# Patient Record
Sex: Female | Born: 1940
Health system: Southern US, Community
[De-identification: ages and names within clinical notes are randomized; demographics above are authoritative.]

## PROBLEM LIST (undated history)

## (undated) DIAGNOSIS — D259 Leiomyoma of uterus, unspecified: Secondary | ICD-10-CM

## (undated) DIAGNOSIS — F419 Anxiety disorder, unspecified: Secondary | ICD-10-CM

## (undated) DIAGNOSIS — L80 Vitiligo: Secondary | ICD-10-CM

## (undated) DIAGNOSIS — E785 Hyperlipidemia, unspecified: Secondary | ICD-10-CM

## (undated) DIAGNOSIS — K5792 Diverticulitis of intestine, part unspecified, without perforation or abscess without bleeding: Secondary | ICD-10-CM

## (undated) DIAGNOSIS — I10 Essential (primary) hypertension: Secondary | ICD-10-CM

## (undated) DIAGNOSIS — M1990 Unspecified osteoarthritis, unspecified site: Secondary | ICD-10-CM

## (undated) DIAGNOSIS — K589 Irritable bowel syndrome without diarrhea: Secondary | ICD-10-CM

## (undated) DIAGNOSIS — F329 Major depressive disorder, single episode, unspecified: Secondary | ICD-10-CM

## (undated) DIAGNOSIS — K219 Gastro-esophageal reflux disease without esophagitis: Secondary | ICD-10-CM

## (undated) DIAGNOSIS — F32A Depression, unspecified: Secondary | ICD-10-CM

## (undated) DIAGNOSIS — K5732 Diverticulitis of large intestine without perforation or abscess without bleeding: Secondary | ICD-10-CM

## (undated) HISTORY — DX: Leiomyoma of uterus, unspecified: D25.9

## (undated) HISTORY — PX: NO PAST SURGERIES: SHX2092

## (undated) HISTORY — PX: COLONOSCOPY: SHX174

## (undated) HISTORY — DX: Hyperlipidemia, unspecified: E78.5

## (undated) HISTORY — DX: Diverticulitis of large intestine without perforation or abscess without bleeding: K57.32

---

## 1997-11-01 LAB — HM DEXA SCAN

## 2001-07-20 LAB — HM MAMMOGRAPHY

## 2011-01-05 ENCOUNTER — Other Ambulatory Visit: Payer: Self-pay | Admitting: Neurology

## 2011-01-05 DIAGNOSIS — R269 Unspecified abnormalities of gait and mobility: Secondary | ICD-10-CM

## 2011-01-05 DIAGNOSIS — I10 Essential (primary) hypertension: Secondary | ICD-10-CM

## 2011-01-10 ENCOUNTER — Ambulatory Visit
Admission: RE | Admit: 2011-01-10 | Discharge: 2011-01-10 | Disposition: A | Discharge status: Home / Self Care | Payer: MEDICARE | Source: Ambulatory Visit | Attending: Neurology | Admitting: Neurology

## 2011-01-10 DIAGNOSIS — R269 Unspecified abnormalities of gait and mobility: Secondary | ICD-10-CM

## 2011-01-10 DIAGNOSIS — I10 Essential (primary) hypertension: Secondary | ICD-10-CM

## 2012-01-09 ENCOUNTER — Encounter (HOSPITAL_COMMUNITY): Payer: Self-pay | Admitting: *Deleted

## 2012-01-09 ENCOUNTER — Emergency Department (INDEPENDENT_AMBULATORY_CARE_PROVIDER_SITE_OTHER)
Admission: EM | Admit: 2012-01-09 | Discharge: 2012-01-09 | Disposition: A | Discharge status: Home / Self Care | Payer: Medicare Other | Source: Home / Self Care | Attending: Emergency Medicine | Admitting: Emergency Medicine

## 2012-01-09 DIAGNOSIS — K5732 Diverticulitis of large intestine without perforation or abscess without bleeding: Secondary | ICD-10-CM | POA: Diagnosis not present

## 2012-01-09 DIAGNOSIS — K5792 Diverticulitis of intestine, part unspecified, without perforation or abscess without bleeding: Secondary | ICD-10-CM

## 2012-01-09 HISTORY — DX: Irritable bowel syndrome, unspecified: K58.9

## 2012-01-09 HISTORY — DX: Essential (primary) hypertension: I10

## 2012-01-09 HISTORY — DX: Anxiety disorder, unspecified: F41.9

## 2012-01-09 HISTORY — DX: Diverticulitis of intestine, part unspecified, without perforation or abscess without bleeding: K57.92

## 2012-01-09 LAB — POCT URINALYSIS DIP (DEVICE)
Protein, ur: NEGATIVE mg/dL
Specific Gravity, Urine: 1.02 (ref 1.005–1.030)
Urobilinogen, UA: 0.2 mg/dL (ref 0.0–1.0)
pH: 7 (ref 5.0–8.0)

## 2012-01-09 MED ORDER — CIPROFLOXACIN HCL 500 MG PO TABS: 500.0000 mg | ORAL_TABLET | Freq: Two times a day (BID) | ORAL | Status: AC

## 2012-01-09 MED ORDER — METRONIDAZOLE 500 MG PO TABS: 500.0000 mg | ORAL_TABLET | Freq: Three times a day (TID) | ORAL | Status: AC

## 2012-01-09 NOTE — Discharge Instructions (Signed)
Go to the ED immediately for the symptoms we discussed, if you do not get better after being on the antibiotics for 3 days, or any other concerns.

## 2012-01-09 NOTE — ED Notes (Signed)
Pt with c/o left lower abdominal pain x one week - pt states "I know it's diverticulitis" - ibuprofen and heating pad used without relief - - denies n/v - denies urinary symptoms - same pain as past diverticulitis

## 2012-01-09 NOTE — ED Provider Notes (Signed)
History     CSN: [id number]  Arrival date & time 01/09/12  1305   First MD Initiated Contact with Patient 01/09/12 1439      Chief Complaint  Patient presents with  . Abdominal Pain    (Consider location/radiation/quality/duration/timing/severity/associated sxs/prior treatment) HPI Comments: Patient with intermittent, mild, nonradiating left lower quadrant pain for the past week. No abdominal distention, anorexia. No diarrhea, constipation. Is having normal bowel movements. No rectal bleeding, melena, hematochezia. No nausea, vomiting, fevers. No urinary urgency, frequency, hematuria, cloudy or oderous urine, back pain. No vaginal bleeding or discharge. She has been using ibuprofen and heating pad with minimal relief. No aggravating factors. She states this feels identical to previous episodes diverticulitis, and is requesting antibiotics. States that she had a normal colonoscopy 3 years ago  ROS as noted in HPI. All other ROS negative.     Patient is a 71 y.o. female presenting with abdominal pain. The history is provided by the patient. No language interpreter was used.  Abdominal Pain The primary symptoms of the illness include abdominal pain. The current episode started more than 2 days ago. The onset of the illness was gradual. The problem has not changed since onset. The patient states that she believes she is currently not pregnant. The patient has not had a change in bowel habit. Symptoms associated with the illness do not include chills, anorexia, diaphoresis, heartburn, constipation, urgency, hematuria, frequency or back pain.    Past Medical History  Diagnosis Date  . Hypertension   . Diverticulitis   . IBS (irritable bowel syndrome)   . Anxiety     History reviewed. No pertinent past surgical history.  History reviewed. No pertinent family history.  History  Substance Use Topics  . Smoking status: Former Games developer  . Smokeless tobacco: Not on file  . Alcohol Use:  Yes    OB History    Grav Para Term Preterm Abortions TAB SAB Ect Mult Living                  Review of Systems  Constitutional: Negative for chills and diaphoresis.  Gastrointestinal: Positive for abdominal pain. Negative for heartburn, constipation and anorexia.  Genitourinary: Negative for urgency, frequency and hematuria.  Musculoskeletal: Negative for back pain.    Allergies  Eggs or egg-derived products  Home Medications   Current Outpatient Rx  Name Route Sig Dispense Refill  . ALPRAZOLAM 0.25 MG PO TABS Oral Take 0.25 mg by mouth at bedtime as needed.    Marland Kitchen AMLODIPINE BESY-BENAZEPRIL HCL PO Oral Take by mouth.    . IBUPROFEN 400 MG PO TABS Oral Take 400 mg by mouth every 6 (six) hours as needed.    Marland Kitchen CIPROFLOXACIN HCL 500 MG PO TABS Oral Take 1 tablet (500 mg total) by mouth every 12 (twelve) hours. X 10 days 20 tablet 0  . METRONIDAZOLE 500 MG PO TABS Oral Take 1 tablet (500 mg total) by mouth 3 (three) times daily. X 10 days 30 tablet 0    BP 166/87  Pulse 72  Temp 98.8 F (37.1 C) (Oral)  Resp 18  SpO2 96%  Physical Exam  Nursing note and vitals reviewed. Constitutional: She is oriented to person, place, and time. She appears well-developed and well-nourished. No distress.  HENT:  Head: Normocephalic and atraumatic.  Eyes: Conjunctivae and EOM are normal.  Neck: Normal range of motion.  Cardiovascular: Normal rate, regular rhythm and normal heart sounds.   Pulmonary/Chest: Effort normal and breath sounds  normal.  Abdominal: Soft. Normal appearance and bowel sounds are normal. She exhibits no distension. There is tenderness in the left lower quadrant. There is no rigidity, no rebound, no guarding, no CVA tenderness, no tenderness at McBurney's point and negative Murphy's sign. No hernia.       Patient refused rectal exam  Musculoskeletal: Normal range of motion.  Neurological: She is alert and oriented to person, place, and time.  Skin: Skin is warm and  dry.  Psychiatric: She has a normal mood and affect. Her behavior is normal. Judgment and thought content normal.    ED Course  Procedures (including critical care time)  Labs Reviewed  POCT URINALYSIS DIP (DEVICE) - Abnormal; Notable for the following:    Hgb urine dipstick TRACE (*)     All other components within normal limits  LAB REPORT - SCANNED   No results found.   1. Diverticulitis     Results for orders placed during the hospital encounter of 01/09/12  POCT URINALYSIS DIP (DEVICE)      Component Value Range   Glucose, UA NEGATIVE  NEGATIVE mg/dL   Bilirubin Urine NEGATIVE  NEGATIVE   Ketones, ur NEGATIVE  NEGATIVE mg/dL   Specific Gravity, Urine 1.020  1.005 - 1.030   Hgb urine dipstick TRACE (*) NEGATIVE   pH 7.0  5.0 - 8.0   Protein, ur NEGATIVE  NEGATIVE mg/dL   Urobilinogen, UA 0.2  0.0 - 1.0 mg/dL   Nitrite NEGATIVE  NEGATIVE   Leukocytes, UA NEGATIVE  NEGATIVE     MDM  No previous records or labs available.  Pt abd exam is benign, no peritoneal signs. No evidence of surgical abd. Doubt SBO, mesenteric ischemia, appendicitis, hepatitis, cholecystitis, pancreatitis, or perforated viscus. No evidence to support or suggest GYN pathology such as ovarian torsion or infection. Will treat as uncomplicated diverticulitis, home with Cipro and Flagyl. She will followup with her primary care physician.  Patient hypertensive today pt states she forgot to take BP meds this am. Has no other red flags concerning for hypertensive emergency. Gave strict return precautions.  Luiz Blare, MD 01/10/12 2040

## 2012-01-19 DIAGNOSIS — I10 Essential (primary) hypertension: Secondary | ICD-10-CM | POA: Diagnosis not present

## 2012-01-19 DIAGNOSIS — K5732 Diverticulitis of large intestine without perforation or abscess without bleeding: Secondary | ICD-10-CM | POA: Diagnosis not present

## 2012-01-19 DIAGNOSIS — G4700 Insomnia, unspecified: Secondary | ICD-10-CM | POA: Diagnosis not present

## 2012-01-19 DIAGNOSIS — E785 Hyperlipidemia, unspecified: Secondary | ICD-10-CM | POA: Diagnosis not present

## 2012-02-11 ENCOUNTER — Emergency Department (INDEPENDENT_AMBULATORY_CARE_PROVIDER_SITE_OTHER)
Admission: EM | Admit: 2012-02-11 | Discharge: 2012-02-11 | Disposition: A | Discharge status: Home / Self Care | Payer: Medicare Other | Source: Home / Self Care | Attending: Emergency Medicine | Admitting: Emergency Medicine

## 2012-02-11 ENCOUNTER — Encounter (HOSPITAL_COMMUNITY): Payer: Self-pay

## 2012-02-11 DIAGNOSIS — K5732 Diverticulitis of large intestine without perforation or abscess without bleeding: Secondary | ICD-10-CM

## 2012-02-11 DIAGNOSIS — K5792 Diverticulitis of intestine, part unspecified, without perforation or abscess without bleeding: Secondary | ICD-10-CM

## 2012-02-11 MED ORDER — CIPROFLOXACIN HCL 500 MG PO TABS: ORAL_TABLET | ORAL | Status: DC

## 2012-02-11 MED ORDER — METRONIDAZOLE 250 MG PO TABS: 250.0000 mg | ORAL_TABLET | Freq: Three times a day (TID) | ORAL | Status: DC

## 2012-02-11 NOTE — ED Notes (Signed)
C/o her lower abdomin has been hurting for past couple of days, feels like her diverticulitis is cutting up again, c/o she could not get in to see her MD today , so she came here. Has been using motrin and a heating pad w  Minimal relief . In the past , she has done well w Rx antibioptics, but her MD in [place] had always given her flagyl AND  a  DOUBLE  dose of ceftin for first dose, and then q 12 h ; she feels she would do well w this combination again; NAD

## 2012-02-11 NOTE — ED Provider Notes (Signed)
Chief Complaint  Patient presents with  . Abdominal Pain    History of Present Illness:    Glenda Abbott is a 71 year old female with a history of diverticulitis and she thinks she's having a flare up right now. This is her second flare up in the past 6 years. She first noted left lower quadrant pain without radiation in June of this year, about a month ago. The pain was a constant ache, worse with eating especially certain foods such as greasy foods and better with ibuprofen. She came here on July 1. Her urinalysis was normal. She was given Cipro and metronidazole. The pain seemed to get better and she saw her primary care physician, Dr. Bufford Spikes who felt that she was getting better. Over the past week the pain has flared back up again. She denies any fever, chills, nausea, or vomiting. Her appetite has been good and her weight is down about 17 pounds in the last year. She attributes this to the death of her husband. She denies any dysuria, frequency, urgency, or hematuria. Her bowel movements are regular. She denies any blood in the stool. She does have a history of irritable bowel syndrome. Her last colonoscopy was October 2011. She denies any GYN complaints. She hasn't had a pelvic exam or Pap smear since she turned 67, but states she does not want to have a pelvic exam done today.  Review of Systems:  Other than noted above, the patient denies any of the following symptoms: Constitutional:  No fever, chills, fatigue, weight loss or anorexia. Lungs:  No cough or shortness of breath. Heart:  No chest pain, palpitations, syncope or edema.  No cardiac history. Abdomen:  No nausea, vomiting, hematememesis, melena, diarrhea, or hematochezia. GU:  No dysuria, frequency, urgency, or hematuria. Gyn:  No vaginal discharge, itching, abnormal bleeding, dyspareunia, or pelvic pain. Skin:  No rash or itching.  PMFSH:  Past medical history, family history, social history, meds, and allergies were reviewed along  with nurse's notes.  No prior abdominal surgeries, past history of GI problems, STDs or GYN problems.  No history of aspirin or NSAID use.  No excessive alcohol intake.  Physical Exam:   Vital signs:  BP 147/85  Pulse 87  Temp 98.5 F (36.9 C) (Oral)  Resp 16  SpO2 97% Gen:  Alert, oriented, in no distress. Lungs:  Breath sounds clear and equal bilaterally.  No wheezes, rales or rhonchi. Heart:  Regular rhythm.  No gallops or murmers.   Abdomen:  Abdomen is soft, flat, nondistended. There is no organomegaly or mass. Bowel sounds are normally active. She has mild left lower quadrant tenderness to palpation without guarding or rebound. Skin:  Clear, warm and dry.  No rash.  Labs:   Results for orders placed during the hospital encounter of 01/09/12  POCT URINALYSIS DIP (DEVICE)      Component Value Range   Glucose, UA NEGATIVE  NEGATIVE mg/dL   Bilirubin Urine NEGATIVE  NEGATIVE   Ketones, ur NEGATIVE  NEGATIVE mg/dL   Specific Gravity, Urine 1.020  1.005 - 1.030   Hgb urine dipstick TRACE (*) NEGATIVE   pH 7.0  5.0 - 8.0   Protein, ur NEGATIVE  NEGATIVE mg/dL   Urobilinogen, UA 0.2  0.0 - 1.0 mg/dL   Nitrite NEGATIVE  NEGATIVE   Leukocytes, UA NEGATIVE  NEGATIVE    Assessment:  The encounter diagnosis was Diverticulitis. This is most likely diverticulitis, however I told the patient that there are many other  things that can cause this kind of pain including ovarian cyst, ovarian cancer, torsion of the ovary, irritable bowel syndrome, kidney stone or kidney infection, or ischemic bowel disease. I recommended that she followup with her primary care physician in 2 weeks. I think if she's not better at the end of that time a CT scan would be warranted.  Plan:   1.  The following meds were prescribed:   New Prescriptions   CIPROFLOXACIN (CIPRO) 500 MG TABLET    Take 2 tablets now and then 1 BID   METRONIDAZOLE (FLAGYL) 250 MG TABLET    Take 1 tablet (250 mg total) by mouth 3 (three)  times daily.   2.  The patient was instructed in symptomatic care and handouts were given. 3.  The patient was told to return if becoming worse in any way, if no better in 3 or 4 days, and given some red flag symptoms that would indicate earlier return.    Reuben Likes, MD 02/11/12 [phone]

## 2012-02-16 ENCOUNTER — Emergency Department (HOSPITAL_COMMUNITY)
Admission: EM | Admit: 2012-02-16 | Discharge: 2012-02-17 | Disposition: A | Discharge status: Home / Self Care | Payer: Medicare Other | Attending: Emergency Medicine | Admitting: Emergency Medicine

## 2012-02-16 ENCOUNTER — Encounter (HOSPITAL_COMMUNITY): Payer: Self-pay | Admitting: Emergency Medicine

## 2012-02-16 DIAGNOSIS — N859 Noninflammatory disorder of uterus, unspecified: Secondary | ICD-10-CM | POA: Diagnosis not present

## 2012-02-16 DIAGNOSIS — R509 Fever, unspecified: Secondary | ICD-10-CM | POA: Diagnosis not present

## 2012-02-16 DIAGNOSIS — R1900 Intra-abdominal and pelvic swelling, mass and lump, unspecified site: Secondary | ICD-10-CM | POA: Diagnosis not present

## 2012-02-16 DIAGNOSIS — K5730 Diverticulosis of large intestine without perforation or abscess without bleeding: Secondary | ICD-10-CM | POA: Diagnosis not present

## 2012-02-16 DIAGNOSIS — R1032 Left lower quadrant pain: Secondary | ICD-10-CM | POA: Diagnosis not present

## 2012-02-16 DIAGNOSIS — R197 Diarrhea, unspecified: Secondary | ICD-10-CM | POA: Insufficient documentation

## 2012-02-16 DIAGNOSIS — R10819 Abdominal tenderness, unspecified site: Secondary | ICD-10-CM | POA: Diagnosis not present

## 2012-02-16 DIAGNOSIS — R188 Other ascites: Secondary | ICD-10-CM | POA: Diagnosis not present

## 2012-02-16 DIAGNOSIS — I10 Essential (primary) hypertension: Secondary | ICD-10-CM | POA: Diagnosis not present

## 2012-02-16 DIAGNOSIS — N9489 Other specified conditions associated with female genital organs and menstrual cycle: Secondary | ICD-10-CM | POA: Insufficient documentation

## 2012-02-16 DIAGNOSIS — D259 Leiomyoma of uterus, unspecified: Secondary | ICD-10-CM | POA: Diagnosis not present

## 2012-02-16 LAB — CBC WITH DIFFERENTIAL/PLATELET
Basophils Absolute: 0 10*3/uL (ref 0.0–0.1)
Eosinophils Absolute: 0 10*3/uL (ref 0.0–0.7)
Eosinophils Relative: 0 % (ref 0–5)
HCT: 45.8 % (ref 36.0–46.0)
Lymphocytes Relative: 17 % (ref 12–46)
MCH: 29.9 pg (ref 26.0–34.0)
MCV: 88.4 fL (ref 78.0–100.0)
Monocytes Absolute: 0.6 10*3/uL (ref 0.1–1.0)
RDW: 13 % (ref 11.5–15.5)
WBC: 9.4 10*3/uL (ref 4.0–10.5)

## 2012-02-16 LAB — URINALYSIS, ROUTINE W REFLEX MICROSCOPIC
Nitrite: NEGATIVE
Specific Gravity, Urine: 1.02 (ref 1.005–1.030)
Urobilinogen, UA: 0.2 mg/dL (ref 0.0–1.0)

## 2012-02-16 LAB — COMPREHENSIVE METABOLIC PANEL
AST: 28 U/L (ref 0–37)
CO2: 29 mEq/L (ref 19–32)
Calcium: 10.1 mg/dL (ref 8.4–10.5)
Creatinine, Ser: 0.97 mg/dL (ref 0.50–1.10)
GFR calc Af Amer: 68 mL/min — ABNORMAL LOW (ref >90)
GFR calc non Af Amer: 58 mL/min — ABNORMAL LOW (ref >90)
Glucose, Bld: 111 mg/dL — ABNORMAL HIGH (ref 70–99)
Total Protein: 8.7 g/dL — ABNORMAL HIGH (ref 6.0–8.3)

## 2012-02-16 LAB — URINE MICROSCOPIC-ADD ON

## 2012-02-16 NOTE — ED Provider Notes (Signed)
History     CSN: [id number]  Arrival date & time 02/16/12  1725   First MD Initiated Contact with Patient 02/16/12 2034      Chief Complaint  Patient presents with  . Abdominal Pain    (Consider location/radiation/quality/duration/timing/severity/associated sxs/prior treatment) HPI Comments: Patient presents today with a chief complaint of abdominal pain.  Pain predominantly located in the LLQ.  Pain does not radiate.  She states that at times the pain is a crampy pain and at times the pain is sharp.  She reports that the pain has been intermittent for the past month.  She has a history of Diverticulitis and reports that the pain is similar to when she has had Diverticulitis in the past.  She was seen by Urgent Care approximately one month ago and given a ten day course of Cipro and Flagyl.  She was then seen at Urgent Care five days ago for this same pain.  At that time she was given another prescription for Cipro and Flagyl, which she reports that she is taking as directed.  Today she reports that she developed a fever.  Oral temperature of 101.6 orally.  She denies any nausea or vomiting.  Denies constipation, but reports intermittent diarrhea over the past 2 weeks.  No blood in her stool or melena.    Patient is a 71 y.o. female presenting with abdominal pain. The history is provided by the patient.  Abdominal Pain The primary symptoms of the illness include abdominal pain, fever and diarrhea. The primary symptoms of the illness do not include shortness of breath, nausea, vomiting, hematochezia, dysuria, vaginal discharge or vaginal bleeding.  The patient states that she believes she is currently not pregnant. The patient has had a change in bowel habit. Symptoms associated with the illness do not include chills, constipation, urgency, hematuria or frequency.    Past Medical History  Diagnosis Date  . Hypertension   . Diverticulitis   . IBS (irritable bowel syndrome)   . Anxiety      History reviewed. No pertinent past surgical history.  No family history on file.  History  Substance Use Topics  . Smoking status: Former Games developer  . Smokeless tobacco: Not on file  . Alcohol Use: Yes    OB History    Grav Para Term Preterm Abortions TAB SAB Ect Mult Living                  Review of Systems  Constitutional: Positive for fever. Negative for chills.  Respiratory: Negative for shortness of breath.   Cardiovascular: Negative for chest pain.  Gastrointestinal: Positive for abdominal pain and diarrhea. Negative for nausea, vomiting, constipation, blood in stool, hematochezia, abdominal distention, anal bleeding and rectal pain.  Genitourinary: Negative for dysuria, urgency, frequency, hematuria, flank pain, vaginal bleeding and vaginal discharge.  Psychiatric/Behavioral: Negative for self-injury.    Allergies  Eggs or egg-derived products  Home Medications   Current Outpatient Rx  Name Route Sig Dispense Refill  . ALPRAZOLAM 0.25 MG PO TABS Oral Take 0.25 mg by mouth at bedtime as needed. For sleep    . AMLODIPINE BESY-BENAZEPRIL HCL PO Oral Take by mouth.    Marland Kitchen CIPROFLOXACIN HCL 500 MG PO TABS Oral Take 500 mg by mouth 2 (two) times daily.    . IBUPROFEN 400 MG PO TABS Oral Take 400 mg by mouth every 6 (six) hours as needed. For pain    . METRONIDAZOLE 250 MG PO TABS Oral Take 250 mg  by mouth 3 (three) times daily.      BP 104/90  Pulse 86  Temp 98 F (36.7 C) (Oral)  Resp 18  SpO2 100%  Physical Exam  Nursing note and vitals reviewed. Constitutional: She appears well-developed and well-nourished. No distress.  HENT:  Head: Normocephalic and atraumatic.  Mouth/Throat: Oropharynx is clear and moist.  Neck: Normal range of motion. Neck supple.  Cardiovascular: Normal rate, regular rhythm and normal heart sounds.   Pulmonary/Chest: Effort normal and breath sounds normal.  Abdominal: Normal appearance and bowel sounds are normal. She exhibits no  mass. There is tenderness in the suprapubic area and left lower quadrant. There is no rigidity, no rebound, no guarding and no CVA tenderness.  Genitourinary: Vagina normal. There is no rash, tenderness or lesion on the right labia. There is no rash, tenderness or lesion on the left labia. Cervix exhibits no motion tenderness. Right adnexum displays no mass and no tenderness. Left adnexum displays no mass and no tenderness.  Neurological: She is alert.  Skin: Skin is warm and dry. She is not diaphoretic.  Psychiatric: She has a normal mood and affect.    ED Course  Procedures (including critical care time)  Labs Reviewed  CBC WITH DIFFERENTIAL - Abnormal; Notable for the following:    RBC 5.18 (*)     Hemoglobin 15.5 (*)     All other components within normal limits  COMPREHENSIVE METABOLIC PANEL - Abnormal; Notable for the following:    Glucose, Bld 111 (*)     Total Protein 8.7 (*)     Total Bilirubin 0.2 (*)     GFR calc non Af Amer 58 (*)     GFR calc Af Amer 68 (*)     All other components within normal limits  URINALYSIS, ROUTINE W REFLEX MICROSCOPIC - Abnormal; Notable for the following:    Leukocytes, UA SMALL (*)     All other components within normal limits  URINE MICROSCOPIC-ADD ON - Abnormal; Notable for the following:    Squamous Epithelial / LPF FEW (*)     Casts HYALINE CASTS (*)     All other components within normal limits      No diagnosis found.    MDM  Patient with history of Diverticulitis presents with intermittent LLQ abdominal pain.  She has finished a ten day course of Cipro and Flagyl and is currently on the sixth day of her second course.  Pain has not improved and today she developed a fever.  Labs unremarkable.  WBC within normal limits.   Patient with abnormal CT of ab/pelvis.  Radiology recommended getting an ultrasound of the pelvis.  Ultrasound has been ordered.  Patient discussed with Dr. Norlene Campbell who will follow up on the ultrasound results.           Pascal Lux [place], PA-C 02/17/12 1257

## 2012-02-16 NOTE — ED Notes (Signed)
PT c/o LLQ pain off and on x's 2 weeks,  St's she has diverticulosis and is having a flare up.  Pt denies any nausea or vomiting.  Pt st's temp was 101.6 earlier today

## 2012-02-17 ENCOUNTER — Emergency Department (HOSPITAL_COMMUNITY): Payer: Medicare Other

## 2012-02-17 ENCOUNTER — Encounter (HOSPITAL_COMMUNITY): Payer: Self-pay | Admitting: Radiology

## 2012-02-17 DIAGNOSIS — N859 Noninflammatory disorder of uterus, unspecified: Secondary | ICD-10-CM | POA: Diagnosis not present

## 2012-02-17 DIAGNOSIS — K5730 Diverticulosis of large intestine without perforation or abscess without bleeding: Secondary | ICD-10-CM | POA: Diagnosis not present

## 2012-02-17 DIAGNOSIS — D259 Leiomyoma of uterus, unspecified: Secondary | ICD-10-CM | POA: Diagnosis not present

## 2012-02-17 DIAGNOSIS — R188 Other ascites: Secondary | ICD-10-CM | POA: Diagnosis not present

## 2012-02-17 DIAGNOSIS — R1032 Left lower quadrant pain: Secondary | ICD-10-CM | POA: Diagnosis not present

## 2012-02-17 MED ORDER — IBUPROFEN 600 MG PO TABS: 600.0000 mg | ORAL_TABLET | Freq: Four times a day (QID) | ORAL | Status: AC | PRN

## 2012-02-17 MED ORDER — MORPHINE SULFATE 4 MG/ML IJ SOLN
4.0000 mg | Freq: Once | INTRAMUSCULAR | Status: AC
  Administered 2012-02-17: 4 mg via INTRAVENOUS
  Filled 2012-02-17: qty 1

## 2012-02-17 MED ORDER — ONDANSETRON HCL 4 MG PO TABS: 4.0000 mg | ORAL_TABLET | Freq: Three times a day (TID) | ORAL | Status: DC | PRN

## 2012-02-17 MED ORDER — OXYCODONE-ACETAMINOPHEN 5-325 MG PO TABS: 2.0000 | ORAL_TABLET | ORAL | Status: DC | PRN

## 2012-02-17 MED ORDER — IOHEXOL 300 MG/ML  SOLN
20.0000 mL | INTRAMUSCULAR | Status: AC
  Administered 2012-02-17: 20 mL via ORAL

## 2012-02-17 MED ORDER — ONDANSETRON HCL 4 MG/2ML IJ SOLN
4.0000 mg | Freq: Once | INTRAMUSCULAR | Status: AC
  Administered 2012-02-17: 4 mg via INTRAVENOUS
  Filled 2012-02-17: qty 2

## 2012-02-17 MED ORDER — IOHEXOL 300 MG/ML  SOLN
100.0000 mL | Freq: Once | INTRAMUSCULAR | Status: AC | PRN
  Administered 2012-02-17: 100 mL via INTRAVENOUS

## 2012-02-17 NOTE — ED Notes (Signed)
Pt has completed oral contrast; CT staff notified.

## 2012-02-17 NOTE — ED Provider Notes (Signed)
Care assumed from prior team.  Pt with h/o diverticulitis, has taken antibiotics x 2 in the last 6 weeks.  No improvement in pain.  No diarrhea, no fevers until today.  Pt had abnormal CT and was ordered u/s of pelvis.   Results for orders placed during the hospital encounter of 02/16/12  CBC WITH DIFFERENTIAL      Component Value Range   WBC 9.4  4.0 - 10.5 K/uL   RBC 5.18 (*) 3.87 - 5.11 MIL/uL   Hemoglobin 15.5 (*) 12.0 - 15.0 g/dL   HCT 16.1  09.6 - 04.5 %   MCV 88.4  78.0 - 100.0 fL   MCH 29.9  26.0 - 34.0 pg   MCHC 33.8  30.0 - 36.0 g/dL   RDW 40.9  81.1 - 91.4 %   Platelets 335  150 - 400 K/uL   Neutrophils Relative 77  43 - 77 %   Neutro Abs 7.2  1.7 - 7.7 K/uL   Lymphocytes Relative 17  12 - 46 %   Lymphs Abs 1.6  0.7 - 4.0 K/uL   Monocytes Relative 6  3 - 12 %   Monocytes Absolute 0.6  0.1 - 1.0 K/uL   Eosinophils Relative 0  0 - 5 %   Eosinophils Absolute 0.0  0.0 - 0.7 K/uL   Basophils Relative 0  0 - 1 %   Basophils Absolute 0.0  0.0 - 0.1 K/uL  COMPREHENSIVE METABOLIC PANEL      Component Value Range   Sodium 138  135 - 145 mEq/L   Potassium 3.7  3.5 - 5.1 mEq/L   Chloride 98  96 - 112 mEq/L   CO2 29  19 - 32 mEq/L   Glucose, Bld 111 (*) 70 - 99 mg/dL   BUN 10  6 - 23 mg/dL   Creatinine, Ser 7.82  0.50 - 1.10 mg/dL   Calcium 95.6  8.4 - 21.3 mg/dL   Total Protein 8.7 (*) 6.0 - 8.3 g/dL   Albumin 4.0  3.5 - 5.2 g/dL   AST 28  0 - 37 U/L   ALT 26  0 - 35 U/L   Alkaline Phosphatase 95  39 - 117 U/L   Total Bilirubin 0.2 (*) 0.3 - 1.2 mg/dL   GFR calc non Af Amer 58 (*) >90 mL/min   GFR calc Af Amer 68 (*) >90 mL/min  URINALYSIS, ROUTINE W REFLEX MICROSCOPIC      Component Value Range   Color, Urine YELLOW  YELLOW   APPearance CLEAR  CLEAR   Specific Gravity, Urine 1.020  1.005 - 1.030   pH 5.5  5.0 - 8.0   Glucose, UA NEGATIVE  NEGATIVE mg/dL   Hgb urine dipstick NEGATIVE  NEGATIVE   Bilirubin Urine NEGATIVE  NEGATIVE   Ketones, ur NEGATIVE  NEGATIVE  mg/dL   Protein, ur NEGATIVE  NEGATIVE mg/dL   Urobilinogen, UA 0.2  0.0 - 1.0 mg/dL   Nitrite NEGATIVE  NEGATIVE   Leukocytes, UA SMALL (*) NEGATIVE  URINE MICROSCOPIC-ADD ON      Component Value Range   Squamous Epithelial / LPF FEW (*) RARE   WBC, UA 0-2  <3 WBC/hpf   Bacteria, UA RARE  RARE   Casts HYALINE CASTS (*) NEGATIVE   Urine-Other MUCOUS PRESENT     US Transvaginal Non-ob  02/17/2012  *RADIOLOGY REPORT*  Clinical Data: Left lower quadrant pelvic pain.  TRANSABDOMINAL AND TRANSVAGINAL ULTRASOUND OF PELVIS Technique:  Both transabdominal and  transvaginal ultrasound examinations of the pelvis were performed. Transabdominal technique was performed for global imaging of the pelvis including uterus, ovaries, adnexal regions, and pelvic cul-de-sac.  It was necessary to proceed with endovaginal exam following the transabdominal exam to visualize the endometrium and adnexa.  Comparison:  02/17/2012 CT  Findings:  Uterus/endometrium: Measures 11.1 x 5.7 x 7.2 cm.  Multiple myometrial calcifications are favored to reflect sequelae of prior fibroids.  There is distension of the endometrium with complex fluid/debris.  No appreciable internal vascularity.  The endometrial distension appears to transition at the cervix.  No mass is visualized in this location.  Right ovary:  Not visualized  Left ovary: Measures 4.2 x 3.1 x 4.1 cm, solid appearance with indistinct margins.  Internal color Doppler flow is documented. The margins are indistinct from adjacent bowel.  There is a tubular debris containing fluid collection along the lateral margin which may correspond to the fallopian tube.  Other findings: No free fluid  IMPRESSION: Distended endometrial cavity with complex fluid and debris; hematometra versus pyometra. Recommend gynecologic consultation and sampling.  Differential for the obstruction includes cervical stenosis or endometrial carcinoma.  Complex appearance to the left adnexa/ovary, inseparable  from an adjacent sigmoid loop. Carcinoma is not excluded.  Debris containing dilatation of the left fallopian tube, which may reflect hematosalpinx or pyosalpinx.  If further imaging is warranted, MRI can better characterize the above findings.  Discussed via telephone with Dr. Norlene Campbell at 05:30 a.m. on 02/17/2012.  Original Report Authenticated By: Waneta Martins, M.D.   US Pelvis Complete  02/17/2012  *RADIOLOGY REPORT*  Clinical Data: Left lower quadrant pelvic pain.  TRANSABDOMINAL AND TRANSVAGINAL ULTRASOUND OF PELVIS Technique:  Both transabdominal and transvaginal ultrasound examinations of the pelvis were performed. Transabdominal technique was performed for global imaging of the pelvis including uterus, ovaries, adnexal regions, and pelvic cul-de-sac.  It was necessary to proceed with endovaginal exam following the transabdominal exam to visualize the endometrium and adnexa.  Comparison:  02/17/2012 CT  Findings:  Uterus/endometrium: Measures 11.1 x 5.7 x 7.2 cm.  Multiple myometrial calcifications are favored to reflect sequelae of prior fibroids.  There is distension of the endometrium with complex fluid/debris.  No appreciable internal vascularity.  The endometrial distension appears to transition at the cervix.  No mass is visualized in this location.  Right ovary:  Not visualized  Left ovary: Measures 4.2 x 3.1 x 4.1 cm, solid appearance with indistinct margins.  Internal color Doppler flow is documented. The margins are indistinct from adjacent bowel.  There is a tubular debris containing fluid collection along the lateral margin which may correspond to the fallopian tube.  Other findings: No free fluid  IMPRESSION: Distended endometrial cavity with complex fluid and debris; hematometra versus pyometra. Recommend gynecologic consultation and sampling.  Differential for the obstruction includes cervical stenosis or endometrial carcinoma.  Complex appearance to the left adnexa/ovary, inseparable  from an adjacent sigmoid loop. Carcinoma is not excluded.  Debris containing dilatation of the left fallopian tube, which may reflect hematosalpinx or pyosalpinx.  If further imaging is warranted, MRI can better characterize the above findings.  Discussed via telephone with Dr. Norlene Campbell at 05:30 a.m. on 02/17/2012.  Original Report Authenticated By: Waneta Martins, M.D.   Ct Abdomen Pelvis W Contrast  02/17/2012  *RADIOLOGY REPORT*  Clinical Data: Left lower quadrant abdominal pain  CT ABDOMEN AND PELVIS WITH CONTRAST  Technique:  Multidetector CT imaging of the abdomen and pelvis was performed following the standard protocol  during bolus administration of intravenous contrast.  Contrast: OMNIPAQUE IOHEXOL 300 MG/ML  SOLN .  Comparison: None.  Findings: Limited images through the lung bases demonstrate no significant appreciable abnormality. The heart size is within normal limits. No pleural or pericardial effusion.  Low attenuation of the liver is nonspecific post contrast however suggests fatty infiltration. Punctate calcifications are in keeping with sequelae of prior granulomatous infection.  Unremarkable biliary system, spleen, pancreas. Bilateral adreniform enlargement without a dominant/measurable nodule.  Relatively symmetric renal enhancement.  No hydronephrosis or hydroureter.  There are a couple tiny hypodensities on the left, too small further characterize.  Colon diverticulosis. A segment of sigmoid colon appears thick- walled and is inseparable from the left adnexa (series 2, image 67).  There is mild adjacent stranding and fluid.  Normal appendix. No bowel obstruction.  No free intraperitoneal air.  The endometrial cavity is distended with fluid.  There are coarse calcifications within the uterine parenchyma. See description of the left adnexa above.  No right adnexal abnormality.  There is scattered atherosclerotic calcification of the aorta and its branches. No aneurysmal dilatation.   Multilevel degenerative changes of the imaged spine. No acute or aggressive appearing osseous lesion. Grade 1 anterolisthesis of L4- L5.  IMPRESSION:  Mild fat stranding / fluid in the left lower quadrant abuts the left ovary and a segment of sigmoid colon.  Given the underlying diverticulosis, diverticulitis is a consideration. Colonoscopy follow-up is recommended after symptoms resolve to exclude an underlying mass.  Fluid distended endometrium is nonspecific.  Recommend pelvic ultrasound to further evaluate this finding as well as the left adnexa.  Original Report Authenticated By: Waneta Martins, M.D.    Results discussed with radiology, and with patient.  No prior h/o abn pap smears, no radiation to pelvis.  Pt will need to see GYN for further workup of these findings.  Will d/c with percocet, motrin, zofran.  Pt was informed that differential did include potential cancer of the uterus/ovary  Olivia Mackie, MD 02/17/12 [phone]

## 2012-02-17 NOTE — ED Notes (Signed)
Pt to US.

## 2012-02-18 NOTE — ED Provider Notes (Signed)
Medical screening examination/treatment/procedure(s) were conducted as a shared visit with non-physician practitioner(s) and myself.  I personally evaluated the patient during the encounter  Derwood Kaplan, MD 02/18/12 [phone]

## 2012-02-23 ENCOUNTER — Emergency Department (HOSPITAL_COMMUNITY)
Admission: EM | Admit: 2012-02-23 | Discharge: 2012-02-23 | Disposition: A | Discharge status: Home / Self Care | Payer: Medicare Other | Attending: Emergency Medicine | Admitting: Emergency Medicine

## 2012-02-23 ENCOUNTER — Encounter (HOSPITAL_COMMUNITY): Payer: Self-pay

## 2012-02-23 DIAGNOSIS — Z87891 Personal history of nicotine dependence: Secondary | ICD-10-CM | POA: Diagnosis not present

## 2012-02-23 DIAGNOSIS — I10 Essential (primary) hypertension: Secondary | ICD-10-CM | POA: Insufficient documentation

## 2012-02-23 DIAGNOSIS — N760 Acute vaginitis: Secondary | ICD-10-CM | POA: Insufficient documentation

## 2012-02-23 DIAGNOSIS — N898 Other specified noninflammatory disorders of vagina: Secondary | ICD-10-CM | POA: Diagnosis not present

## 2012-02-23 DIAGNOSIS — N9489 Other specified conditions associated with female genital organs and menstrual cycle: Secondary | ICD-10-CM

## 2012-02-23 DIAGNOSIS — K5732 Diverticulitis of large intestine without perforation or abscess without bleeding: Secondary | ICD-10-CM | POA: Diagnosis not present

## 2012-02-23 DIAGNOSIS — B999 Unspecified infectious disease: Secondary | ICD-10-CM | POA: Diagnosis not present

## 2012-02-23 DIAGNOSIS — N949 Unspecified condition associated with female genital organs and menstrual cycle: Secondary | ICD-10-CM | POA: Diagnosis not present

## 2012-02-23 DIAGNOSIS — R1900 Intra-abdominal and pelvic swelling, mass and lump, unspecified site: Secondary | ICD-10-CM | POA: Diagnosis not present

## 2012-02-23 LAB — CBC WITH DIFFERENTIAL/PLATELET
Basophils Absolute: 0 10*3/uL (ref 0.0–0.1)
Basophils Relative: 0 % (ref 0–1)
Eosinophils Relative: 1 % (ref 0–5)
HCT: 43.1 % (ref 36.0–46.0)
MCH: 29.1 pg (ref 26.0–34.0)
MCHC: 33.2 g/dL (ref 30.0–36.0)
MCV: 87.8 fL (ref 78.0–100.0)
Monocytes Absolute: 1.2 10*3/uL — ABNORMAL HIGH (ref 0.1–1.0)
RDW: 13.2 % (ref 11.5–15.5)

## 2012-02-23 LAB — URINALYSIS, ROUTINE W REFLEX MICROSCOPIC
Glucose, UA: NEGATIVE mg/dL
Ketones, ur: NEGATIVE mg/dL
Protein, ur: NEGATIVE mg/dL

## 2012-02-23 LAB — BASIC METABOLIC PANEL
CO2: 29 mEq/L (ref 19–32)
Calcium: 9.7 mg/dL (ref 8.4–10.5)
Creatinine, Ser: 0.8 mg/dL (ref 0.50–1.10)

## 2012-02-23 MED ORDER — TRAMADOL HCL 50 MG PO TABS: 50.0000 mg | ORAL_TABLET | Freq: Four times a day (QID) | ORAL | Status: AC | PRN

## 2012-02-23 MED ORDER — CIPROFLOXACIN HCL 500 MG PO TABS: 500.0000 mg | ORAL_TABLET | Freq: Two times a day (BID) | ORAL | Status: AC

## 2012-02-23 MED ORDER — MORPHINE SULFATE 4 MG/ML IJ SOLN
4.0000 mg | Freq: Once | INTRAMUSCULAR | Status: AC
  Administered 2012-02-23: 4 mg via INTRAVENOUS
  Filled 2012-02-23: qty 1

## 2012-02-23 MED ORDER — METRONIDAZOLE 500 MG PO TABS: 500.0000 mg | ORAL_TABLET | Freq: Two times a day (BID) | ORAL | Status: AC

## 2012-02-23 MED ORDER — MORPHINE SULFATE 4 MG/ML IJ SOLN
4.0000 mg | Freq: Once | INTRAMUSCULAR | Status: DC
  Filled 2012-02-23: qty 1

## 2012-02-23 MED ORDER — MORPHINE SULFATE 4 MG/ML IJ SOLN
4.0000 mg | Freq: Once | INTRAMUSCULAR | Status: AC
  Administered 2012-02-23: 4 mg via INTRAVENOUS

## 2012-02-23 NOTE — ED Provider Notes (Signed)
Medical screening examination/treatment/procedure(s) were conducted as a shared visit with non-physician practitioner(s) and myself.  I personally evaluated the patient during the encounter The case as well described by the physician assistant.  On my exam this pleasant elderly female now presents for second time in a week with concerns of ongoing abdominal pain, vaginal bleeding and discharge.  Notably, patient's prior evaluation demonstrated a likely adnexal mass for which the patient has been unable to secure a follow up appointment, and this accounts for part of today's presentation.  On my exam the patient is in no distress.  We discussed the prior findings, the concerns.  We discussed the case with our gynecology colleagues, to arrange for expeditious followup care.  Given the concern of pyosalpinx she was started on antibiotics, and discharged with return precautions, follow up instructions  Gerhard Munch, MD 02/23/12 [phone]

## 2012-02-23 NOTE — ED Provider Notes (Signed)
History     CSN: [id number]  Arrival date & time 02/23/12  1001   First MD Initiated Contact with Patient 02/23/12 1047      Chief Complaint  Patient presents with  . Vaginal Discharge  . Vaginitis    (Consider location/radiation/quality/duration/timing/severity/associated sxs/prior treatment) HPI History from patient. 71 year old female with past medical history diverticulitis, hypertension, and IBS presents with vaginal pain and discharge. She was seen for abdominal pain in the ED last week. A CT showed irregular stranding in the left lower quad so ultrasound was obtained. This showed a possible adnexal mass on the left side. Patient was discharged with Percocet and Zofran and instructed to followup with GYN. She states that she tried to make an appointment but, despite calling several different practices, was unable to secure followup before late August.  She returns today as her pain has been persistent. She states that she has been unable to tolerate the Percocet as it makes her lightheaded. She has been taking ibuprofen which does help somewhat. She also states that she developed new vaginal discharge this morning which concerned her. She reports that it is clear to green with some blood streaking.  Has not had discharge previously. Has had to change her pad once this morning. Pain is essentially unchanged from prior. No other new symptoms. No f/c.  Past Medical History  Diagnosis Date  . Hypertension   . Diverticulitis   . IBS (irritable bowel syndrome)   . Anxiety     No past surgical history on file.  No family history on file.  History  Substance Use Topics  . Smoking status: Former Games developer  . Smokeless tobacco: Not on file  . Alcohol Use: Yes    OB History    Grav Para Term Preterm Abortions TAB SAB Ect Mult Living                  Review of Systems  Constitutional: Negative for fever and chills.  Respiratory: Negative for cough and shortness of breath.     Cardiovascular: Negative for chest pain.  Gastrointestinal: Positive for abdominal pain. Negative for nausea, vomiting and diarrhea.  Genitourinary: Positive for vaginal bleeding and vaginal discharge. Negative for dysuria.  Skin: Negative for rash.  Neurological: Negative for weakness.  All other systems reviewed and are negative.    Allergies  Eggs or egg-derived products and Mustard  Home Medications   Current Outpatient Rx  Name Route Sig Dispense Refill  . ALPRAZOLAM 0.5 MG PO TABS Oral Take 0.5 mg by mouth at bedtime.    Marland Kitchen AMLODIPINE BESY-BENAZEPRIL HCL PO Oral Take 1 tablet by mouth daily.     Marland Kitchen CALCIUM-VITAMIN D PO Oral Take 1 tablet by mouth daily.    Marland Kitchen VITAMIN B 12 PO Oral Take 1 tablet by mouth daily.    . IBUPROFEN 600 MG PO TABS Oral Take 1 tablet (600 mg total) by mouth every 6 (six) hours as needed for pain. 30 tablet 0  . ADULT MULTIVITAMIN W/MINERALS CH Oral Take 1 tablet by mouth daily.      BP 150/98  Pulse 114  Temp 98.5 F (36.9 C) (Oral)  Resp 24  SpO2 100%  Physical Exam  Nursing note and vitals reviewed. Constitutional: She appears well-developed and well-nourished. No distress.  HENT:  Head: Normocephalic and atraumatic.  Mouth/Throat: Oropharynx is clear and moist. No oropharyngeal exudate.  Eyes:       Normal appearance  Neck: Normal range of motion.  Cardiovascular: Normal rate, regular rhythm and normal heart sounds.   Pulmonary/Chest: Effort normal and breath sounds normal. She exhibits no tenderness.  Abdominal: Soft. Bowel sounds are normal.       Mild ttp to LLQ, no guard or rebound, abd not rigid  Genitourinary:       Deferred as pt had pelvic exam last week with no sig change in sx today  Musculoskeletal: Normal range of motion.  Neurological: She is alert.  Skin: Skin is warm and dry. She is not diaphoretic.  Psychiatric: She has a normal mood and affect.    ED Course  Procedures (including critical care time)   Labs  Reviewed  CBC WITH DIFFERENTIAL  BASIC METABOLIC PANEL  URINALYSIS, ROUTINE W REFLEX MICROSCOPIC   No results found. US Transvaginal Non-ob  02/17/2012 *RADIOLOGY REPORT* Clinical Data: Left lower quadrant pelvic pain. TRANSABDOMINAL AND TRANSVAGINAL ULTRASOUND OF PELVIS Technique: Both transabdominal and transvaginal ultrasound examinations of the pelvis were performed. Transabdominal technique was performed for global imaging of the pelvis including uterus, ovaries, adnexal regions, and pelvic cul-de-sac. It was necessary to proceed with endovaginal exam following the transabdominal exam to visualize the endometrium and adnexa. Comparison: 02/17/2012 CT Findings: Uterus/endometrium: Measures 11.1 x 5.7 x 7.2 cm. Multiple myometrial calcifications are favored to reflect sequelae of prior fibroids. There is distension of the endometrium with complex fluid/debris. No appreciable internal vascularity. The endometrial distension appears to transition at the cervix. No mass is visualized in this location. Right ovary: Not visualized Left ovary: Measures 4.2 x 3.1 x 4.1 cm, solid appearance with indistinct margins. Internal color Doppler flow is documented. The margins are indistinct from adjacent bowel. There is a tubular debris containing fluid collection along the lateral margin which may correspond to the fallopian tube. Other findings: No free fluid IMPRESSION: Distended endometrial cavity with complex fluid and debris; hematometra versus pyometra. Recommend gynecologic consultation and sampling. Differential for the obstruction includes cervical stenosis or endometrial carcinoma. Complex appearance to the left adnexa/ovary, inseparable from an adjacent sigmoid loop. Carcinoma is not excluded. Debris containing dilatation of the left fallopian tube, which may reflect hematosalpinx or pyosalpinx. If further imaging is warranted, MRI can better characterize the above findings. Discussed via telephone with Dr.  Norlene Campbell at 05:30 a.m. on 02/17/2012. Original Report Authenticated By: Waneta Martins, M.D.    No diagnosis found.    MDM  Pt with PMH diverticulitis, seen last week - US pelvis showed a distension of endometrium, small ?mass to L adnexa. She returns today c/o continued pain which was not controlled well with ibuprofen at home and new bleeding and discharge. She describes some greenish appearing dc which could possibly be pyosalpinx. This is supported by new leukocytosis as compared with last week to 20k.  Case d/w Dr. Jeraldine Loots. We elected to contact GYN on call as pt has not been seen by a GYN yet locally although she has set up an appt with Dr. Dion Body. Case discussed with Dr. Normand Sloop. She states she will talk with Dr. Ginnie Smart partner in her group to make sure they are aware of the patient. Dr. Normand Sloop also recs that I give the patient her office's number as well in case she can't be seen sooner by Dr. Dion Body. Recs giving coverage for poss pyosalpinx with Cipro/Flagyl. Pt also given Ultram for pain as she did not tolerate Percocet well. Pt instructed to f/u with [facility] at [facility] if needed sooner. Reasons to return to [facility] ED discussed. Pt verbalized understanding and  agreed to plan.       Grant Fontana, PA-C 02/23/12 1550

## 2012-02-23 NOTE — ED Notes (Signed)
SET UP PELIVIC CART

## 2012-02-23 NOTE — ED Notes (Signed)
Here for vaginal pain and discharge, last week was seen here and diagnosed with mass

## 2012-02-24 ENCOUNTER — Other Ambulatory Visit: Payer: Self-pay | Admitting: Obstetrics and Gynecology

## 2012-02-24 ENCOUNTER — Other Ambulatory Visit (HOSPITAL_COMMUNITY)
Admission: RE | Admit: 2012-02-24 | Discharge: 2012-02-24 | Disposition: A | Discharge status: Home / Self Care | Payer: Medicare Other | Source: Ambulatory Visit | Attending: Obstetrics and Gynecology | Admitting: Obstetrics and Gynecology

## 2012-02-24 DIAGNOSIS — Z1151 Encounter for screening for human papillomavirus (HPV): Secondary | ICD-10-CM | POA: Insufficient documentation

## 2012-02-24 DIAGNOSIS — Z01419 Encounter for gynecological examination (general) (routine) without abnormal findings: Secondary | ICD-10-CM | POA: Insufficient documentation

## 2012-02-24 DIAGNOSIS — R9389 Abnormal findings on diagnostic imaging of other specified body structures: Secondary | ICD-10-CM | POA: Diagnosis not present

## 2012-02-24 DIAGNOSIS — N9489 Other specified conditions associated with female genital organs and menstrual cycle: Secondary | ICD-10-CM | POA: Diagnosis not present

## 2012-03-03 ENCOUNTER — Other Ambulatory Visit: Payer: Self-pay | Admitting: Obstetrics and Gynecology

## 2012-03-10 ENCOUNTER — Other Ambulatory Visit: Payer: Self-pay | Admitting: Obstetrics and Gynecology

## 2012-03-21 LAB — HM PAP SMEAR

## 2012-03-22 ENCOUNTER — Encounter (HOSPITAL_COMMUNITY): Payer: Self-pay

## 2012-03-22 ENCOUNTER — Encounter (HOSPITAL_COMMUNITY)
Admission: RE | Admit: 2012-03-22 | Discharge: 2012-03-22 | Disposition: A | Discharge status: Home / Self Care | Payer: Medicare Other | Source: Ambulatory Visit | Attending: Obstetrics and Gynecology | Admitting: Obstetrics and Gynecology

## 2012-03-22 DIAGNOSIS — Z01818 Encounter for other preprocedural examination: Secondary | ICD-10-CM | POA: Diagnosis not present

## 2012-03-22 DIAGNOSIS — Z01812 Encounter for preprocedural laboratory examination: Secondary | ICD-10-CM | POA: Diagnosis not present

## 2012-03-22 HISTORY — DX: Depression, unspecified: F32.A

## 2012-03-22 HISTORY — DX: Gastro-esophageal reflux disease without esophagitis: K21.9

## 2012-03-22 HISTORY — DX: Unspecified osteoarthritis, unspecified site: M19.90

## 2012-03-22 HISTORY — DX: Major depressive disorder, single episode, unspecified: F32.9

## 2012-03-22 LAB — CBC
HCT: 39.9 % (ref 36.0–46.0)
Hemoglobin: 12.8 g/dL (ref 12.0–15.0)
RBC: 4.46 MIL/uL (ref 3.87–5.11)
WBC: 11.8 10*3/uL — ABNORMAL HIGH (ref 4.0–10.5)

## 2012-03-22 LAB — BASIC METABOLIC PANEL
Chloride: 104 mEq/L (ref 96–112)
GFR calc Af Amer: >90 mL/min (ref >90)
GFR calc non Af Amer: 84 mL/min — ABNORMAL LOW (ref >90)
Potassium: 4.1 mEq/L (ref 3.5–5.1)
Sodium: 140 mEq/L (ref 135–145)

## 2012-03-22 NOTE — Patient Instructions (Addendum)
   Your procedure is scheduled on: Monday, 03/28/12  Enter through the Main Entrance of [facility] at: 10 am Pick up the phone at the desk and dial [phone] and inform us of your arrival.  Please call this number if you have any problems the morning of surgery: [phone]  Remember: Do not eat food after midnight: Sunday Do not drink clear liquids after: 7:30am Monday 03/28/12 Take these medicines the morning of surgery with a SIP OF WATER:  Do not wear jewelry, make-up, or FINGER nail polish No metal in your hair or on your body. Do not wear lotions, powders, perfumes or deodorant. Do not shave 48 hours prior to surgery. Do not bring valuables to the hospital. Contacts, dentures or bridgework may not be worn into surgery.  Patients discharged on the day of surgery will not be allowed to drive home.   Patient to arrange for a ride home prior to surgery date.  Remember to use your hibiclens as instructed.Please shower with 1/2 bottle the evening before your surgery and the other 1/2 bottle the morning of surgery. Neck down avoiding private area.

## 2012-03-24 DIAGNOSIS — N719 Inflammatory disease of uterus, unspecified: Secondary | ICD-10-CM | POA: Diagnosis not present

## 2012-03-24 DIAGNOSIS — N9489 Other specified conditions associated with female genital organs and menstrual cycle: Secondary | ICD-10-CM | POA: Diagnosis not present

## 2012-03-28 ENCOUNTER — Encounter (HOSPITAL_COMMUNITY): Admission: RE | Payer: Self-pay | Source: Ambulatory Visit

## 2012-03-28 ENCOUNTER — Ambulatory Visit (HOSPITAL_COMMUNITY)
Admission: RE | Admit: 2012-03-28 | Payer: Medicare Other | Source: Ambulatory Visit | Admitting: Obstetrics and Gynecology

## 2012-03-28 SURGERY — DILATATION AND CURETTAGE /HYSTEROSCOPY
Anesthesia: Choice

## 2012-03-29 ENCOUNTER — Other Ambulatory Visit: Payer: Self-pay | Admitting: Obstetrics and Gynecology

## 2012-03-29 DIAGNOSIS — N9489 Other specified conditions associated with female genital organs and menstrual cycle: Secondary | ICD-10-CM

## 2012-04-05 ENCOUNTER — Ambulatory Visit
Admission: RE | Admit: 2012-04-05 | Discharge: 2012-04-05 | Disposition: A | Discharge status: Home / Self Care | Payer: Medicare Other | Source: Ambulatory Visit | Attending: Obstetrics and Gynecology | Admitting: Obstetrics and Gynecology

## 2012-04-05 ENCOUNTER — Ambulatory Visit
Admission: RE | Admit: 2012-04-05 | Discharge: 2012-04-05 | Disposition: A | Discharge status: Home / Self Care | Payer: BC Managed Care – HMO | Source: Ambulatory Visit | Attending: Obstetrics and Gynecology | Admitting: Obstetrics and Gynecology

## 2012-04-05 DIAGNOSIS — R109 Unspecified abdominal pain: Secondary | ICD-10-CM | POA: Diagnosis not present

## 2012-04-05 DIAGNOSIS — N9489 Other specified conditions associated with female genital organs and menstrual cycle: Secondary | ICD-10-CM

## 2012-04-05 DIAGNOSIS — N838 Other noninflammatory disorders of ovary, fallopian tube and broad ligament: Secondary | ICD-10-CM | POA: Diagnosis not present

## 2012-04-05 DIAGNOSIS — K5730 Diverticulosis of large intestine without perforation or abscess without bleeding: Secondary | ICD-10-CM | POA: Diagnosis not present

## 2012-04-05 MED ORDER — GADOBENATE DIMEGLUMINE 529 MG/ML IV SOLN
15.0000 mL | Freq: Once | INTRAVENOUS | Status: AC | PRN
  Administered 2012-04-05: 15 mL via INTRAVENOUS

## 2012-04-07 ENCOUNTER — Other Ambulatory Visit: Payer: Self-pay | Admitting: Obstetrics and Gynecology

## 2012-04-07 DIAGNOSIS — L0291 Cutaneous abscess, unspecified: Secondary | ICD-10-CM

## 2012-04-08 DIAGNOSIS — K5732 Diverticulitis of large intestine without perforation or abscess without bleeding: Secondary | ICD-10-CM | POA: Diagnosis not present

## 2012-04-11 ENCOUNTER — Encounter (HOSPITAL_COMMUNITY): Payer: Self-pay | Admitting: Pharmacy Technician

## 2012-04-11 ENCOUNTER — Other Ambulatory Visit: Payer: Self-pay | Admitting: Radiology

## 2012-04-11 ENCOUNTER — Other Ambulatory Visit: Payer: Self-pay | Admitting: Obstetrics and Gynecology

## 2012-04-12 ENCOUNTER — Ambulatory Visit (HOSPITAL_COMMUNITY)
Admission: RE | Admit: 2012-04-12 | Discharge: 2012-04-12 | Disposition: A | Discharge status: Home / Self Care | Payer: Medicare Other | Source: Ambulatory Visit | Attending: Obstetrics and Gynecology | Admitting: Obstetrics and Gynecology

## 2012-04-12 ENCOUNTER — Encounter (HOSPITAL_COMMUNITY): Payer: Self-pay

## 2012-04-12 DIAGNOSIS — K5730 Diverticulosis of large intestine without perforation or abscess without bleeding: Secondary | ICD-10-CM | POA: Insufficient documentation

## 2012-04-12 DIAGNOSIS — N83209 Unspecified ovarian cyst, unspecified side: Secondary | ICD-10-CM | POA: Insufficient documentation

## 2012-04-12 DIAGNOSIS — L0291 Cutaneous abscess, unspecified: Secondary | ICD-10-CM

## 2012-04-12 LAB — CBC WITH DIFFERENTIAL/PLATELET
Basophils Absolute: 0 10*3/uL (ref 0.0–0.1)
Eosinophils Relative: 2 % (ref 0–5)
HCT: 40.9 % (ref 36.0–46.0)
Lymphocytes Relative: 16 % (ref 12–46)
Lymphs Abs: 2.5 10*3/uL (ref 0.7–4.0)
MCV: 86.8 fL (ref 78.0–100.0)
Monocytes Absolute: 1.1 10*3/uL — ABNORMAL HIGH (ref 0.1–1.0)
Neutro Abs: 11.8 10*3/uL — ABNORMAL HIGH (ref 1.7–7.7)
Platelets: 402 10*3/uL — ABNORMAL HIGH (ref 150–400)
RBC: 4.71 MIL/uL (ref 3.87–5.11)
RDW: 13.9 % (ref 11.5–15.5)
WBC: 15.7 10*3/uL — ABNORMAL HIGH (ref 4.0–10.5)

## 2012-04-12 LAB — PROTIME-INR: Prothrombin Time: 12.4 seconds (ref 11.6–15.2)

## 2012-04-12 LAB — APTT: aPTT: 34 seconds (ref 24–37)

## 2012-04-12 MED ORDER — IOHEXOL 300 MG/ML  SOLN
80.0000 mL | Freq: Once | INTRAMUSCULAR | Status: AC | PRN
  Administered 2012-04-12: 80 mL via INTRAVENOUS

## 2012-04-12 MED ORDER — CEFAZOLIN SODIUM 1-5 GM-% IV SOLN: 1.0000 g | Freq: Once | INTRAVENOUS | Status: DC

## 2012-04-12 MED ORDER — SODIUM CHLORIDE 0.9 % IV SOLN
INTRAVENOUS | Status: DC
  Administered 2012-04-12: 1000 mL via INTRAVENOUS

## 2012-04-12 MED ORDER — CEFAZOLIN SODIUM 1-5 GM-% IV SOLN
INTRAVENOUS | Status: AC
  Filled 2012-04-12: qty 50

## 2012-04-13 ENCOUNTER — Ambulatory Visit (INDEPENDENT_AMBULATORY_CARE_PROVIDER_SITE_OTHER): Payer: Self-pay | Admitting: General Surgery

## 2012-04-19 DIAGNOSIS — I10 Essential (primary) hypertension: Secondary | ICD-10-CM | POA: Diagnosis not present

## 2012-04-19 DIAGNOSIS — K5732 Diverticulitis of large intestine without perforation or abscess without bleeding: Secondary | ICD-10-CM | POA: Diagnosis not present

## 2012-04-19 DIAGNOSIS — R300 Dysuria: Secondary | ICD-10-CM | POA: Diagnosis not present

## 2012-04-19 DIAGNOSIS — F411 Generalized anxiety disorder: Secondary | ICD-10-CM | POA: Diagnosis not present

## 2012-04-19 DIAGNOSIS — R7989 Other specified abnormal findings of blood chemistry: Secondary | ICD-10-CM | POA: Diagnosis not present

## 2012-04-19 DIAGNOSIS — E785 Hyperlipidemia, unspecified: Secondary | ICD-10-CM | POA: Diagnosis not present

## 2012-04-19 DIAGNOSIS — G4700 Insomnia, unspecified: Secondary | ICD-10-CM | POA: Diagnosis not present

## 2012-04-22 ENCOUNTER — Encounter (HOSPITAL_COMMUNITY): Payer: Self-pay | Admitting: Emergency Medicine

## 2012-04-22 ENCOUNTER — Emergency Department (HOSPITAL_COMMUNITY)
Admission: EM | Admit: 2012-04-22 | Discharge: 2012-04-22 | Disposition: A | Discharge status: Home / Self Care | Payer: Medicare Other | Attending: Emergency Medicine | Admitting: Emergency Medicine

## 2012-04-22 DIAGNOSIS — L02219 Cutaneous abscess of trunk, unspecified: Secondary | ICD-10-CM | POA: Insufficient documentation

## 2012-04-22 DIAGNOSIS — Z87891 Personal history of nicotine dependence: Secondary | ICD-10-CM | POA: Insufficient documentation

## 2012-04-22 DIAGNOSIS — K219 Gastro-esophageal reflux disease without esophagitis: Secondary | ICD-10-CM | POA: Insufficient documentation

## 2012-04-22 DIAGNOSIS — M129 Arthropathy, unspecified: Secondary | ICD-10-CM | POA: Insufficient documentation

## 2012-04-22 DIAGNOSIS — L0390 Cellulitis, unspecified: Secondary | ICD-10-CM

## 2012-04-22 DIAGNOSIS — I10 Essential (primary) hypertension: Secondary | ICD-10-CM | POA: Diagnosis not present

## 2012-04-22 DIAGNOSIS — K589 Irritable bowel syndrome without diarrhea: Secondary | ICD-10-CM | POA: Insufficient documentation

## 2012-04-22 DIAGNOSIS — L03319 Cellulitis of trunk, unspecified: Secondary | ICD-10-CM | POA: Diagnosis not present

## 2012-04-22 MED ORDER — CLINDAMYCIN HCL 150 MG PO CAPS: 300.0000 mg | ORAL_CAPSULE | Freq: Three times a day (TID) | ORAL | Status: DC
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
## 2012-04-22 NOTE — ED Notes (Signed)
Pt here with discoloration and swelling of left side; pt sts recent studies for possible abscess in abd in that area; pt denies injury or any other symptoms; pt denies pain; pt unsure if still has abscess or not; pt sts was told she did via MRI and then when came back in for follow up was told not present any more

## 2012-04-27 ENCOUNTER — Encounter (HOSPITAL_COMMUNITY): Payer: Self-pay | Admitting: Emergency Medicine

## 2012-04-27 ENCOUNTER — Emergency Department (HOSPITAL_COMMUNITY)
Admission: EM | Admit: 2012-04-27 | Discharge: 2012-04-27 | Disposition: A | Discharge status: Home / Self Care | Payer: Medicare Other | Attending: Emergency Medicine | Admitting: Emergency Medicine

## 2012-04-27 DIAGNOSIS — L03818 Cellulitis of other sites: Secondary | ICD-10-CM | POA: Diagnosis not present

## 2012-04-27 DIAGNOSIS — I10 Essential (primary) hypertension: Secondary | ICD-10-CM | POA: Insufficient documentation

## 2012-04-27 DIAGNOSIS — M129 Arthropathy, unspecified: Secondary | ICD-10-CM | POA: Diagnosis not present

## 2012-04-27 DIAGNOSIS — Z87891 Personal history of nicotine dependence: Secondary | ICD-10-CM | POA: Diagnosis not present

## 2012-04-27 DIAGNOSIS — L02219 Cutaneous abscess of trunk, unspecified: Secondary | ICD-10-CM | POA: Insufficient documentation

## 2012-04-27 DIAGNOSIS — K589 Irritable bowel syndrome without diarrhea: Secondary | ICD-10-CM | POA: Diagnosis not present

## 2012-04-27 DIAGNOSIS — K219 Gastro-esophageal reflux disease without esophagitis: Secondary | ICD-10-CM | POA: Insufficient documentation

## 2012-04-27 DIAGNOSIS — F411 Generalized anxiety disorder: Secondary | ICD-10-CM | POA: Insufficient documentation

## 2012-04-27 DIAGNOSIS — L0390 Cellulitis, unspecified: Secondary | ICD-10-CM
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
## 2012-04-27 NOTE — ED Notes (Signed)
Reports was here on 4th d/t having bruise like area on L side and was d/x with cellulitis, and reports that she got prescription, and pharmacy filled it for wrong dose (lower dose than prescribed); pt reports that cellulitis getting worse

## 2012-04-27 NOTE — ED Notes (Signed)
Pt. Stable, alert and oriented x3. Pt. Verbalized understanding of discharge instructions.

## 2012-04-28 NOTE — ED Provider Notes (Signed)
I saw and evaluated the patient, reviewed the resident's note and I agree with the findings and plan.  Tobin Chad, MD 04/28/12 0100

## 2012-05-04 DIAGNOSIS — F411 Generalized anxiety disorder: Secondary | ICD-10-CM | POA: Diagnosis not present

## 2012-05-04 DIAGNOSIS — T2100XA Burn of unspecified degree of trunk, unspecified site, initial encounter: Secondary | ICD-10-CM | POA: Diagnosis not present

## 2012-05-04 DIAGNOSIS — D259 Leiomyoma of uterus, unspecified: Secondary | ICD-10-CM | POA: Diagnosis not present

## 2012-05-04 DIAGNOSIS — K5732 Diverticulitis of large intestine without perforation or abscess without bleeding: Secondary | ICD-10-CM | POA: Diagnosis not present

## 2012-05-13 ENCOUNTER — Encounter (INDEPENDENT_AMBULATORY_CARE_PROVIDER_SITE_OTHER): Payer: Self-pay | Admitting: General Surgery

## 2012-05-13 ENCOUNTER — Ambulatory Visit (INDEPENDENT_AMBULATORY_CARE_PROVIDER_SITE_OTHER): Payer: Medicare Other | Admitting: General Surgery

## 2012-05-13 VITALS — BP 126/76 | HR 76 | Temp 97.0°F | Resp 18 | Ht 64.0 in | Wt 171.2 lb

## 2012-05-13 DIAGNOSIS — K5732 Diverticulitis of large intestine without perforation or abscess without bleeding: Secondary | ICD-10-CM

## 2012-05-13 HISTORY — DX: Diverticulitis of large intestine without perforation or abscess without bleeding: K57.32

## 2012-05-13 NOTE — Patient Instructions (Signed)
Diverticulitis °A diverticulum is a small pouch or sac on the colon. Diverticulosis is the presence of these diverticula on the colon. Diverticulitis is the irritation (inflammation) or infection of diverticula. °CAUSES  °The colon and its diverticula contain bacteria. If food particles block the tiny opening to a diverticulum, the bacteria inside can grow and cause an increase in pressure. This leads to infection and inflammation and is called diverticulitis. °SYMPTOMS  °· Abdominal pain and tenderness. Usually, the pain is located on the left side of your abdomen. However, it could be located elsewhere. °· Fever. °· Bloating. °· Feeling sick to your stomach (nausea). °· Throwing up (vomiting). °· Abnormal stools. °DIAGNOSIS  °Your caregiver will take a history and perform a physical exam. Since many things can cause abdominal pain, other tests may be necessary. Tests may include: °· Blood tests. °· Urine tests. °· X-ray of the abdomen. °· CT scan of the abdomen. °Sometimes, surgery is needed to determine if diverticulitis or other conditions are causing your symptoms. °TREATMENT  °Most of the time, you can be treated without surgery. Treatment includes: °· Resting the bowels by only having liquids for a few days. As you improve, you will need to eat a low-fiber diet. °· Intravenous (IV) fluids if you are losing body fluids (dehydrated). °· Antibiotic medicines that treat infections may be given. °· Pain and nausea medicine, if needed. °· Surgery if the inflamed diverticulum has burst. °HOME CARE INSTRUCTIONS  °· Try a clear liquid diet (broth, tea, or water for as long as directed by your caregiver). You may then gradually begin a low-fiber diet as tolerated. A low-fiber diet is a diet with less than 10 grams of fiber. Choose the foods below to reduce fiber in the diet: °· White breads, cereals, rice, and pasta. °· Cooked fruits and vegetables or soft fresh fruits and vegetables without the skin. °· Ground or  well-cooked tender beef, ham, veal, lamb, pork, or poultry. °· Eggs and seafood. °· After your diverticulitis symptoms have improved, your caregiver may put you on a high-fiber diet. A high-fiber diet includes 14 grams of fiber for every 1000 calories consumed. For a standard 2000 calorie diet, you would need 28 grams of fiber. Follow these diet guidelines to help you increase the fiber in your diet. It is important to slowly increase the amount fiber in your diet to avoid gas, constipation, and bloating. °· Choose whole-grain breads, cereals, pasta, and [place] rice. °· Choose fresh fruits and vegetables with the skin on. Do not overcook vegetables because the more vegetables are cooked, the more fiber is lost. °· Choose more nuts, seeds, legumes, dried peas, beans, and lentils. °· Look for food products that have greater than 3 grams of fiber per serving on the Nutrition Facts label. °· Take all medicine as directed by your caregiver. °· If your caregiver has given you a follow-up appointment, it is very important that you go. Not going could result in lasting (chronic) or permanent injury, pain, and disability. If there is any problem keeping the appointment, call to reschedule. °SEEK MEDICAL CARE IF:  °· Your pain does not improve. °· You have a hard time advancing your diet beyond clear liquids. °· Your bowel movements do not return to normal. °SEEK IMMEDIATE MEDICAL CARE IF:  °· Your pain becomes worse. °· You have an oral temperature above 102° F (38.9° C), not controlled by medicine. °· You have repeated vomiting. °· You have bloody or black, tarry stools. °· Symptoms   that brought you to your caregiver become worse or are not getting better. °MAKE SURE YOU:  °· Understand these instructions. °· Will watch your condition. °· Will get help right away if you are not doing well or get worse. °Document Released: 04/15/2005 Document Revised: 09/28/2011 Document Reviewed: 08/11/2010 °[employer]® Patient Information  ©2013 [employer]. ° °
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
## 2012-05-31 DIAGNOSIS — M25519 Pain in unspecified shoulder: Secondary | ICD-10-CM | POA: Diagnosis not present

## 2012-05-31 DIAGNOSIS — H1045 Other chronic allergic conjunctivitis: Secondary | ICD-10-CM | POA: Diagnosis not present

## 2012-05-31 DIAGNOSIS — J069 Acute upper respiratory infection, unspecified: Secondary | ICD-10-CM | POA: Diagnosis not present

## 2012-07-10 ENCOUNTER — Encounter (HOSPITAL_COMMUNITY): Payer: Self-pay | Admitting: *Deleted

## 2012-07-10 ENCOUNTER — Emergency Department (HOSPITAL_COMMUNITY)
Admission: EM | Admit: 2012-07-10 | Discharge: 2012-07-10 | Disposition: A | Discharge status: Home / Self Care | Payer: Medicare Other | Attending: Emergency Medicine | Admitting: Emergency Medicine

## 2012-07-10 DIAGNOSIS — Z79899 Other long term (current) drug therapy: Secondary | ICD-10-CM | POA: Diagnosis not present

## 2012-07-10 DIAGNOSIS — K219 Gastro-esophageal reflux disease without esophagitis: Secondary | ICD-10-CM | POA: Diagnosis not present

## 2012-07-10 DIAGNOSIS — K5730 Diverticulosis of large intestine without perforation or abscess without bleeding: Secondary | ICD-10-CM | POA: Diagnosis not present

## 2012-07-10 DIAGNOSIS — N390 Urinary tract infection, site not specified: Secondary | ICD-10-CM | POA: Diagnosis not present

## 2012-07-10 DIAGNOSIS — I10 Essential (primary) hypertension: Secondary | ICD-10-CM | POA: Diagnosis not present

## 2012-07-10 DIAGNOSIS — F329 Major depressive disorder, single episode, unspecified: Secondary | ICD-10-CM | POA: Diagnosis not present

## 2012-07-10 DIAGNOSIS — R51 Headache: Secondary | ICD-10-CM | POA: Insufficient documentation

## 2012-07-10 DIAGNOSIS — Z8739 Personal history of other diseases of the musculoskeletal system and connective tissue: Secondary | ICD-10-CM | POA: Diagnosis not present

## 2012-07-10 DIAGNOSIS — K921 Melena: Secondary | ICD-10-CM | POA: Insufficient documentation

## 2012-07-10 DIAGNOSIS — F411 Generalized anxiety disorder: Secondary | ICD-10-CM | POA: Insufficient documentation

## 2012-07-10 DIAGNOSIS — K625 Hemorrhage of anus and rectum: Secondary | ICD-10-CM | POA: Diagnosis not present

## 2012-07-10 DIAGNOSIS — F3289 Other specified depressive episodes: Secondary | ICD-10-CM | POA: Insufficient documentation

## 2012-07-10 DIAGNOSIS — Z87891 Personal history of nicotine dependence: Secondary | ICD-10-CM | POA: Diagnosis not present

## 2012-07-10 DIAGNOSIS — Z8719 Personal history of other diseases of the digestive system: Secondary | ICD-10-CM | POA: Insufficient documentation

## 2012-07-10 DIAGNOSIS — R109 Unspecified abdominal pain: Secondary | ICD-10-CM | POA: Insufficient documentation

## 2012-07-10 DIAGNOSIS — K922 Gastrointestinal hemorrhage, unspecified: Secondary | ICD-10-CM

## 2012-07-10 DIAGNOSIS — K5790 Diverticulosis of intestine, part unspecified, without perforation or abscess without bleeding: Secondary | ICD-10-CM

## 2012-07-10 DIAGNOSIS — K5731 Diverticulosis of large intestine without perforation or abscess with bleeding: Secondary | ICD-10-CM | POA: Diagnosis not present

## 2012-07-10 LAB — URINE MICROSCOPIC-ADD ON

## 2012-07-10 LAB — URINALYSIS, ROUTINE W REFLEX MICROSCOPIC
Bilirubin Urine: NEGATIVE
Glucose, UA: NEGATIVE mg/dL
Ketones, ur: NEGATIVE mg/dL
pH: 6 (ref 5.0–8.0)

## 2012-07-10 LAB — OCCULT BLOOD, POC DEVICE: Fecal Occult Bld: POSITIVE — AB

## 2012-07-10 LAB — COMPREHENSIVE METABOLIC PANEL
ALT: 22 U/L (ref 0–35)
AST: 17 U/L (ref 0–37)
Albumin: 3.7 g/dL (ref 3.5–5.2)
Alkaline Phosphatase: 84 U/L (ref 39–117)
GFR calc Af Amer: 52 mL/min — ABNORMAL LOW (ref >90)
Glucose, Bld: 145 mg/dL — ABNORMAL HIGH (ref 70–99)
Potassium: 4.7 mEq/L (ref 3.5–5.1)
Sodium: 138 mEq/L (ref 135–145)
Total Protein: 7.4 g/dL (ref 6.0–8.3)

## 2012-07-10 LAB — CBC WITH DIFFERENTIAL/PLATELET
Basophils Absolute: 0 10*3/uL (ref 0.0–0.1)
Eosinophils Absolute: 0.1 10*3/uL (ref 0.0–0.7)
Lymphs Abs: 2.6 10*3/uL (ref 0.7–4.0)
MCH: 28.5 pg (ref 26.0–34.0)
Neutrophils Relative %: 69 % (ref 43–77)
Platelets: 281 10*3/uL (ref 150–400)
RBC: 4.45 MIL/uL (ref 3.87–5.11)
WBC: 10.8 10*3/uL — ABNORMAL HIGH (ref 4.0–10.5)

## 2012-07-10 MED ORDER — SULFAMETHOXAZOLE-TRIMETHOPRIM 800-160 MG PO TABS: 1.0000 | ORAL_TABLET | Freq: Two times a day (BID) | ORAL | Status: DC

## 2012-07-10 MED ORDER — SODIUM CHLORIDE 0.9 % IV BOLUS (SEPSIS)
1000.0000 mL | Freq: Once | INTRAVENOUS | Status: AC
  Administered 2012-07-10: 1000 mL via INTRAVENOUS

## 2012-07-10 NOTE — ED Notes (Signed)
Pt has hemorrhoids but also has diverticulitis.  Pt states she had bright red blood in stool for last two days and stools are dark black.  Pt has some LLQ abdominal pain.  No vomiting
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
## 2012-07-21 DIAGNOSIS — K922 Gastrointestinal hemorrhage, unspecified: Secondary | ICD-10-CM | POA: Diagnosis not present

## 2012-07-26 ENCOUNTER — Other Ambulatory Visit: Payer: Self-pay | Admitting: Gastroenterology

## 2012-07-26 DIAGNOSIS — K2970 Gastritis, unspecified, without bleeding: Secondary | ICD-10-CM | POA: Diagnosis not present

## 2012-07-26 DIAGNOSIS — K319 Disease of stomach and duodenum, unspecified: Secondary | ICD-10-CM | POA: Diagnosis not present

## 2012-07-26 DIAGNOSIS — K921 Melena: Secondary | ICD-10-CM | POA: Diagnosis not present

## 2012-07-26 DIAGNOSIS — K5730 Diverticulosis of large intestine without perforation or abscess without bleeding: Secondary | ICD-10-CM | POA: Diagnosis not present

## 2012-07-26 DIAGNOSIS — K625 Hemorrhage of anus and rectum: Secondary | ICD-10-CM | POA: Diagnosis not present

## 2012-07-26 DIAGNOSIS — K5669 Other intestinal obstruction: Secondary | ICD-10-CM | POA: Diagnosis not present

## 2012-07-26 DIAGNOSIS — Z538 Procedure and treatment not carried out for other reasons: Secondary | ICD-10-CM | POA: Diagnosis not present

## 2012-07-26 LAB — HM COLONOSCOPY

## 2012-08-16 ENCOUNTER — Other Ambulatory Visit: Payer: Self-pay | Admitting: Gastroenterology

## 2012-08-16 DIAGNOSIS — K5730 Diverticulosis of large intestine without perforation or abscess without bleeding: Secondary | ICD-10-CM | POA: Diagnosis not present

## 2012-08-31 ENCOUNTER — Other Ambulatory Visit: Payer: Medicare Other

## 2012-09-07 ENCOUNTER — Ambulatory Visit
Admission: RE | Admit: 2012-09-07 | Discharge: 2012-09-07 | Disposition: A | Discharge status: Home / Self Care | Payer: Medicare Other | Source: Ambulatory Visit | Attending: Gastroenterology | Admitting: Gastroenterology

## 2012-09-07 DIAGNOSIS — K6389 Other specified diseases of intestine: Secondary | ICD-10-CM | POA: Diagnosis not present

## 2012-09-07 DIAGNOSIS — K5730 Diverticulosis of large intestine without perforation or abscess without bleeding: Secondary | ICD-10-CM

## 2012-09-28 ENCOUNTER — Encounter (INDEPENDENT_AMBULATORY_CARE_PROVIDER_SITE_OTHER): Payer: Self-pay | Admitting: General Surgery

## 2012-09-28 ENCOUNTER — Ambulatory Visit (INDEPENDENT_AMBULATORY_CARE_PROVIDER_SITE_OTHER): Payer: Medicare Other | Admitting: General Surgery

## 2012-09-28 VITALS — BP 122/68 | HR 110 | Temp 97.8°F | Resp 18 | Ht 64.0 in | Wt 169.8 lb
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
## 2012-09-28 NOTE — Patient Instructions (Addendum)
Call me on Friday afternoon regarding your decisions about the recommended surgery

## 2012-10-03 ENCOUNTER — Other Ambulatory Visit (INDEPENDENT_AMBULATORY_CARE_PROVIDER_SITE_OTHER): Payer: Self-pay | Admitting: General Surgery

## 2012-10-05 ENCOUNTER — Encounter (INDEPENDENT_AMBULATORY_CARE_PROVIDER_SITE_OTHER): Payer: Self-pay

## 2012-10-17 ENCOUNTER — Encounter (HOSPITAL_COMMUNITY): Payer: Self-pay | Admitting: Pharmacy Technician

## 2012-10-24 ENCOUNTER — Encounter (HOSPITAL_COMMUNITY): Payer: Self-pay

## 2012-10-24 ENCOUNTER — Encounter (HOSPITAL_COMMUNITY)
Admission: RE | Admit: 2012-10-24 | Discharge: 2012-10-24 | Disposition: A | Discharge status: Home / Self Care | Payer: Medicare Other | Source: Ambulatory Visit | Attending: General Surgery | Admitting: General Surgery

## 2012-10-24 DIAGNOSIS — N824 Other female intestinal-genital tract fistulae: Secondary | ICD-10-CM | POA: Diagnosis present

## 2012-10-24 DIAGNOSIS — Z79899 Other long term (current) drug therapy: Secondary | ICD-10-CM | POA: Diagnosis not present

## 2012-10-24 DIAGNOSIS — F411 Generalized anxiety disorder: Secondary | ICD-10-CM | POA: Diagnosis present

## 2012-10-24 DIAGNOSIS — I10 Essential (primary) hypertension: Secondary | ICD-10-CM | POA: Diagnosis present

## 2012-10-24 DIAGNOSIS — K56609 Unspecified intestinal obstruction, unspecified as to partial versus complete obstruction: Secondary | ICD-10-CM | POA: Diagnosis not present

## 2012-10-24 DIAGNOSIS — K5669 Other intestinal obstruction: Secondary | ICD-10-CM | POA: Diagnosis present

## 2012-10-24 DIAGNOSIS — Z87891 Personal history of nicotine dependence: Secondary | ICD-10-CM | POA: Diagnosis not present

## 2012-10-24 DIAGNOSIS — K630 Abscess of intestine: Secondary | ICD-10-CM | POA: Diagnosis not present

## 2012-10-24 DIAGNOSIS — K632 Fistula of intestine: Secondary | ICD-10-CM | POA: Diagnosis present

## 2012-10-24 DIAGNOSIS — F329 Major depressive disorder, single episode, unspecified: Secondary | ICD-10-CM | POA: Diagnosis present

## 2012-10-24 DIAGNOSIS — K219 Gastro-esophageal reflux disease without esophagitis: Secondary | ICD-10-CM | POA: Diagnosis present

## 2012-10-24 DIAGNOSIS — K5732 Diverticulitis of large intestine without perforation or abscess without bleeding: Secondary | ICD-10-CM | POA: Diagnosis not present

## 2012-10-24 HISTORY — DX: Vitiligo: L80

## 2012-10-24 LAB — BASIC METABOLIC PANEL
CO2: 28 mEq/L (ref 19–32)
Chloride: 106 mEq/L (ref 96–112)
Sodium: 142 mEq/L (ref 135–145)

## 2012-10-24 LAB — ABO/RH: ABO/RH(D): O POS

## 2012-10-24 LAB — CBC
HCT: 45 % (ref 36.0–46.0)
MCH: 29.3 pg (ref 26.0–34.0)
MCV: 88.6 fL (ref 78.0–100.0)
Platelets: 306 10*3/uL (ref 150–400)
RDW: 13.1 % (ref 11.5–15.5)

## 2012-10-24 NOTE — Progress Notes (Signed)
Pt refused chest x-ray at pre-op appointment.

## 2012-10-24 NOTE — Patient Instructions (Addendum)
20 Glenda Abbott  10/24/2012   Your procedure is scheduled on: 10/31/12  Report to [facility] at 0930 AM.  Call this number if you have problems the morning of surgery [phone]: [phone]   Remember: follow bowel prep instructions    Do not eat food or drink liquids After Midnight.     Take these medicines the morning of surgery with A SIP OF WATER: xanax if needed   Do not wear jewelry, make-up or nail polish.  Do not wear lotions, powders, or perfumes. You may wear deodorant.  Do not shave 48 hours prior to surgery. Men may shave face and neck.  Do not bring valuables to the hospital.  Contacts, dentures or bridgework may not be worn into surgery.  Leave suitcase in the car. After surgery it may be brought to your room.  For patients admitted to the hospital, checkout time is 11:00 AM the day of discharge.    Please read over the following fact sheets that you were given: MRSA Information, blood transfusion fact sheet  Birdie Sons, RN  pre op nurse call if needed [phone]    FAILURE TO FOLLOW THESE INSTRUCTIONS MAY RESULT IN CANCELLATION OF YOUR SURGERY   Patient Signature: ___________________________________________

## 2012-10-31 ENCOUNTER — Inpatient Hospital Stay (HOSPITAL_COMMUNITY): Payer: Medicare Other | Admitting: Anesthesiology

## 2012-10-31 ENCOUNTER — Encounter (HOSPITAL_COMMUNITY): Payer: Self-pay | Admitting: Anesthesiology

## 2012-10-31 ENCOUNTER — Encounter (HOSPITAL_COMMUNITY): Payer: Self-pay | Admitting: *Deleted

## 2012-10-31 ENCOUNTER — Inpatient Hospital Stay (HOSPITAL_COMMUNITY)
Admission: RE | Admit: 2012-10-31 | Discharge: 2012-11-04 | DRG: 330 | Disposition: A | Discharge status: Home / Self Care | Payer: Medicare Other | Source: Ambulatory Visit | Attending: General Surgery | Admitting: General Surgery

## 2012-10-31 ENCOUNTER — Encounter (HOSPITAL_COMMUNITY)
Admission: RE | Disposition: A | Discharge status: Home / Self Care | Payer: Self-pay | Source: Ambulatory Visit | Attending: General Surgery

## 2012-10-31 DIAGNOSIS — K632 Fistula of intestine: Secondary | ICD-10-CM

## 2012-10-31 DIAGNOSIS — N824 Other female intestinal-genital tract fistulae: Secondary | ICD-10-CM

## 2012-10-31 DIAGNOSIS — K219 Gastro-esophageal reflux disease without esophagitis: Secondary | ICD-10-CM | POA: Diagnosis present

## 2012-10-31 DIAGNOSIS — K630 Abscess of intestine: Secondary | ICD-10-CM | POA: Diagnosis not present

## 2012-10-31 DIAGNOSIS — K5732 Diverticulitis of large intestine without perforation or abscess without bleeding: Principal | ICD-10-CM | POA: Diagnosis present

## 2012-10-31 DIAGNOSIS — I10 Essential (primary) hypertension: Secondary | ICD-10-CM | POA: Diagnosis present

## 2012-10-31 DIAGNOSIS — Z87891 Personal history of nicotine dependence: Secondary | ICD-10-CM

## 2012-10-31 DIAGNOSIS — K56609 Unspecified intestinal obstruction, unspecified as to partial versus complete obstruction: Secondary | ICD-10-CM | POA: Diagnosis not present

## 2012-10-31 DIAGNOSIS — K5669 Other intestinal obstruction: Secondary | ICD-10-CM | POA: Diagnosis present

## 2012-10-31 DIAGNOSIS — Z79899 Other long term (current) drug therapy: Secondary | ICD-10-CM

## 2012-10-31 DIAGNOSIS — F329 Major depressive disorder, single episode, unspecified: Secondary | ICD-10-CM | POA: Diagnosis present

## 2012-10-31 DIAGNOSIS — F3289 Other specified depressive episodes: Secondary | ICD-10-CM | POA: Diagnosis present

## 2012-10-31 DIAGNOSIS — K56699 Other intestinal obstruction unspecified as to partial versus complete obstruction: Secondary | ICD-10-CM

## 2012-10-31 DIAGNOSIS — F411 Generalized anxiety disorder: Secondary | ICD-10-CM | POA: Diagnosis present

## 2012-10-31 HISTORY — PX: PARTIAL COLECTOMY: SHX5273

## 2012-10-31 LAB — TYPE AND SCREEN
ABO/RH(D): O POS
Antibody Screen: NEGATIVE

## 2012-10-31 SURGERY — COLECTOMY, PARTIAL
Anesthesia: General | Site: Abdomen | Wound class: Contaminated

## 2012-10-31 MED ORDER — HYDROMORPHONE HCL PF 1 MG/ML IJ SOLN
INTRAMUSCULAR | Status: AC
  Filled 2012-10-31: qty 1

## 2012-10-31 MED ORDER — KCL IN DEXTROSE-NACL 20-5-0.9 MEQ/L-%-% IV SOLN
INTRAVENOUS | Status: DC
  Administered 2012-10-31 – 2012-11-02 (×6): via INTRAVENOUS
  Filled 2012-10-31 (×6): qty 1000

## 2012-10-31 MED ORDER — NEOSTIGMINE METHYLSULFATE 1 MG/ML IJ SOLN
INTRAMUSCULAR | Status: DC | PRN
  Administered 2012-10-31: 4 mg via INTRAVENOUS

## 2012-10-31 MED ORDER — PROPOFOL 10 MG/ML IV BOLUS
INTRAVENOUS | Status: DC | PRN
  Administered 2012-10-31: 150 mg via INTRAVENOUS

## 2012-10-31 MED ORDER — ONDANSETRON HCL 4 MG/2ML IJ SOLN
4.0000 mg | Freq: Four times a day (QID) | INTRAMUSCULAR | Status: DC | PRN
  Administered 2012-11-02: 4 mg via INTRAVENOUS
  Filled 2012-10-31: qty 2

## 2012-10-31 MED ORDER — KCL IN DEXTROSE-NACL 20-5-0.9 MEQ/L-%-% IV SOLN
INTRAVENOUS | Status: AC
  Filled 2012-10-31: qty 1000

## 2012-10-31 MED ORDER — MIDAZOLAM HCL 5 MG/5ML IJ SOLN
INTRAMUSCULAR | Status: DC | PRN
  Administered 2012-10-31: 2 mg via INTRAVENOUS

## 2012-10-31 MED ORDER — HYDROMORPHONE HCL PF 1 MG/ML IJ SOLN
0.2500 mg | INTRAMUSCULAR | Status: DC | PRN
  Administered 2012-10-31 (×4): 0.5 mg via INTRAVENOUS

## 2012-10-31 MED ORDER — NEOMYCIN SULFATE 500 MG PO TABS
1000.0000 mg | ORAL_TABLET | ORAL | Status: DC
  Filled 2012-10-31: qty 2

## 2012-10-31 MED ORDER — HYDROMORPHONE HCL PF 1 MG/ML IJ SOLN
0.2500 mg | INTRAMUSCULAR | Status: DC | PRN
  Administered 2012-10-31: 0.5 mg via INTRAVENOUS

## 2012-10-31 MED ORDER — LACTATED RINGERS IV SOLN
INTRAVENOUS | Status: DC
  Administered 2012-10-31 (×2): via INTRAVENOUS

## 2012-10-31 MED ORDER — PROMETHAZINE HCL 25 MG/ML IJ SOLN: 6.2500 mg | INTRAMUSCULAR | Status: DC | PRN

## 2012-10-31 MED ORDER — AMLODIPINE BESYLATE 5 MG PO TABS
5.0000 mg | ORAL_TABLET | Freq: Once | ORAL | Status: AC
  Administered 2012-10-31: 5 mg via ORAL
  Filled 2012-10-31: qty 1

## 2012-10-31 MED ORDER — 0.9 % SODIUM CHLORIDE (POUR BTL) OPTIME
TOPICAL | Status: DC | PRN
  Administered 2012-10-31: 4000 mL

## 2012-10-31 MED ORDER — SUFENTANIL CITRATE 50 MCG/ML IV SOLN
INTRAVENOUS | Status: DC | PRN
  Administered 2012-10-31: 5 ug via INTRAVENOUS
  Administered 2012-10-31: 10 ug via INTRAVENOUS
  Administered 2012-10-31: 20 ug via INTRAVENOUS
  Administered 2012-10-31: 5 ug via INTRAVENOUS
  Administered 2012-10-31: 10 ug via INTRAVENOUS

## 2012-10-31 MED ORDER — LACTATED RINGERS IV SOLN: INTRAVENOUS | Status: DC

## 2012-10-31 MED ORDER — GLYCOPYRROLATE 0.2 MG/ML IJ SOLN
INTRAMUSCULAR | Status: DC | PRN
  Administered 2012-10-31: .6 mg via INTRAVENOUS

## 2012-10-31 MED ORDER — LABETALOL HCL 5 MG/ML IV SOLN
INTRAVENOUS | Status: DC | PRN
  Administered 2012-10-31 (×3): 5 mg via INTRAVENOUS

## 2012-10-31 MED ORDER — ERYTHROMYCIN BASE 250 MG PO TABS
1000.0000 mg | ORAL_TABLET | ORAL | Status: DC
  Filled 2012-10-31: qty 4

## 2012-10-31 MED ORDER — HEPARIN SODIUM (PORCINE) 5000 UNIT/ML IJ SOLN
5000.0000 [IU] | Freq: Three times a day (TID) | INTRAMUSCULAR | Status: DC
  Administered 2012-10-31 – 2012-11-04 (×11): 5000 [IU] via SUBCUTANEOUS
  Filled 2012-10-31 (×14): qty 1

## 2012-10-31 MED ORDER — HYDROMORPHONE HCL PF 1 MG/ML IJ SOLN
INTRAMUSCULAR | Status: DC | PRN
  Administered 2012-10-31 (×3): .5 mg via INTRAVENOUS

## 2012-10-31 MED ORDER — ROCURONIUM BROMIDE 100 MG/10ML IV SOLN
INTRAVENOUS | Status: DC | PRN
  Administered 2012-10-31: 5 mg via INTRAVENOUS
  Administered 2012-10-31: 40 mg via INTRAVENOUS
  Administered 2012-10-31 (×3): 10 mg via INTRAVENOUS

## 2012-10-31 MED ORDER — ONDANSETRON HCL 4 MG/2ML IJ SOLN
INTRAMUSCULAR | Status: DC | PRN
  Administered 2012-10-31: 4 mg via INTRAVENOUS

## 2012-10-31 MED ORDER — ALVIMOPAN 12 MG PO CAPS
12.0000 mg | ORAL_CAPSULE | Freq: Two times a day (BID) | ORAL | Status: DC
  Administered 2012-11-01 (×2): 12 mg via ORAL
  Filled 2012-10-31 (×4): qty 1

## 2012-10-31 MED ORDER — ONDANSETRON HCL 4 MG PO TABS
4.0000 mg | ORAL_TABLET | Freq: Four times a day (QID) | ORAL | Status: DC | PRN
  Administered 2012-11-03: 4 mg via ORAL
  Filled 2012-10-31: qty 1

## 2012-10-31 MED ORDER — DEXTROSE 5 % IV SOLN
2.0000 g | INTRAVENOUS | Status: AC
  Administered 2012-10-31: 2 g via INTRAVENOUS
  Filled 2012-10-31: qty 2

## 2012-10-31 MED ORDER — HEPARIN SODIUM (PORCINE) 5000 UNIT/ML IJ SOLN
5000.0000 [IU] | Freq: Once | INTRAMUSCULAR | Status: AC
  Administered 2012-10-31: 5000 [IU] via SUBCUTANEOUS
  Filled 2012-10-31: qty 1

## 2012-10-31 MED ORDER — DEXTROSE 5 % IV SOLN
2.0000 g | Freq: Once | INTRAVENOUS | Status: DC
  Filled 2012-10-31: qty 2

## 2012-10-31 MED ORDER — LIDOCAINE HCL (CARDIAC) 20 MG/ML IV SOLN
INTRAVENOUS | Status: DC | PRN
  Administered 2012-10-31: 80 mg via INTRAVENOUS

## 2012-10-31 MED ORDER — MORPHINE SULFATE 2 MG/ML IJ SOLN
2.0000 mg | INTRAMUSCULAR | Status: DC | PRN
  Administered 2012-10-31 – 2012-11-01 (×3): 2 mg via INTRAVENOUS
  Administered 2012-11-01: 4 mg via INTRAVENOUS
  Administered 2012-11-01: 6 mg via INTRAVENOUS
  Administered 2012-11-01: 2 mg via INTRAVENOUS
  Administered 2012-11-01: 4 mg via INTRAVENOUS
  Administered 2012-11-02 – 2012-11-03 (×4): 2 mg via INTRAVENOUS
  Filled 2012-10-31: qty 1
  Filled 2012-10-31: qty 3
  Filled 2012-10-31 (×2): qty 1
  Filled 2012-10-31: qty 2
  Filled 2012-10-31: qty 1
  Filled 2012-10-31: qty 2
  Filled 2012-10-31 (×4): qty 1

## 2012-10-31 MED ORDER — ALVIMOPAN 12 MG PO CAPS
12.0000 mg | ORAL_CAPSULE | Freq: Once | ORAL | Status: AC
  Administered 2012-10-31: 12 mg via ORAL
  Filled 2012-10-31: qty 1

## 2012-10-31 SURGICAL SUPPLY — 60 items
APPLICATOR COTTON TIP 6IN STRL (MISCELLANEOUS) IMPLANT
BLADE EXTENDED COATED 6.5IN (ELECTRODE) ×2 IMPLANT
BLADE HEX COATED 2.75 (ELECTRODE) ×4 IMPLANT
BLADE SURG SZ10 CARB STEEL (BLADE) ×2 IMPLANT
CANISTER SUCTION 2500CC (MISCELLANEOUS) ×2 IMPLANT
CLIP TI LARGE 6 (CLIP) IMPLANT
CLOTH BEACON ORANGE TIMEOUT ST (SAFETY) ×2 IMPLANT
COVER MAYO STAND STRL (DRAPES) ×2 IMPLANT
DRAPE LAPAROSCOPIC ABDOMINAL (DRAPES) ×4 IMPLANT
DRAPE LG THREE QUARTER DISP (DRAPES) ×4 IMPLANT
DRAPE UTILITY XL STRL (DRAPES) ×4 IMPLANT
DRAPE WARM FLUID 44X44 (DRAPE) ×2 IMPLANT
DRSG OPSITE POSTOP 4X8 (GAUZE/BANDAGES/DRESSINGS) ×2 IMPLANT
DRSG PAD ABDOMINAL 8X10 ST (GAUZE/BANDAGES/DRESSINGS) IMPLANT
ELECT REM PT RETURN 9FT ADLT (ELECTROSURGICAL) ×2
ELECTRODE REM PT RTRN 9FT ADLT (ELECTROSURGICAL) ×1 IMPLANT
ENSEAL DEVICE STD TIP 35CM (ENDOMECHANICALS) IMPLANT
GLOVE BIO SURGEON STRL SZ 6.5 (GLOVE) ×4 IMPLANT
GLOVE BIOGEL PI IND STRL 7.0 (GLOVE) ×3 IMPLANT
GLOVE BIOGEL PI IND STRL 7.5 (GLOVE) ×3 IMPLANT
GLOVE BIOGEL PI INDICATOR 7.0 (GLOVE) ×3
GLOVE BIOGEL PI INDICATOR 7.5 (GLOVE) ×3
GLOVE SS BIOGEL STRL SZ 7.5 (GLOVE) ×8 IMPLANT
GLOVE SUPERSENSE BIOGEL SZ 7.5 (GLOVE) ×8
GLOVE SURG SS PI 6.5 STRL IVOR (GLOVE) ×8 IMPLANT
GOWN STRL NON-REIN LRG LVL3 (GOWN DISPOSABLE) ×4 IMPLANT
GOWN STRL REIN XL XLG (GOWN DISPOSABLE) ×16 IMPLANT
HAND ACTIVATED (MISCELLANEOUS) IMPLANT
KIT BASIN OR (CUSTOM PROCEDURE TRAY) ×2 IMPLANT
LEGGING LITHOTOMY PAIR STRL (DRAPES) ×2 IMPLANT
LIGASURE IMPACT 36 18CM CVD LR (INSTRUMENTS) ×2 IMPLANT
LUBRICANT JELLY K Y 4OZ (MISCELLANEOUS) ×2 IMPLANT
NS IRRIG 1000ML POUR BTL (IV SOLUTION) ×8 IMPLANT
PACK GENERAL/GYN (CUSTOM PROCEDURE TRAY) ×2 IMPLANT
RELOAD PROXIMATE 75MM BLUE (ENDOMECHANICALS) ×4 IMPLANT
SEALER TISSUE G2 CVD JAW 35 (ENDOMECHANICALS) IMPLANT
SEALER TISSUE G2 CVD JAW 45CM (ENDOMECHANICALS)
SPONGE GAUZE 4X4 12PLY (GAUZE/BANDAGES/DRESSINGS) ×2 IMPLANT
SPONGE LAP 18X18 X RAY DECT (DISPOSABLE) ×6 IMPLANT
STAPLER CIRC CVD 29MM 37CM (STAPLE) ×2 IMPLANT
STAPLER CUT CVD 40MM BLUE (STAPLE) ×2 IMPLANT
STAPLER PROXIMATE 75MM BLUE (STAPLE) ×2 IMPLANT
STAPLER VISISTAT 35W (STAPLE) ×2 IMPLANT
SUCTION POOLE TIP (SUCTIONS) ×4 IMPLANT
SUT NOV 1 T60/GS (SUTURE) IMPLANT
SUT NOVA NAB DX-16 0-1 5-0 T12 (SUTURE) IMPLANT
SUT NOVA T20/GS 25 (SUTURE) IMPLANT
SUT PDS AB 1 TP1 96 (SUTURE) ×4 IMPLANT
SUT SILK 2 0 (SUTURE) ×1
SUT SILK 2 0 SH CR/8 (SUTURE) ×2 IMPLANT
SUT SILK 2 0SH CR/8 30 (SUTURE) IMPLANT
SUT SILK 2-0 18XBRD TIE 12 (SUTURE) ×1 IMPLANT
SUT SILK 2-0 30XBRD TIE 12 (SUTURE) IMPLANT
SUT SILK 3 0 (SUTURE) ×1
SUT SILK 3 0 SH CR/8 (SUTURE) ×4 IMPLANT
SUT SILK 3-0 18XBRD TIE 12 (SUTURE) ×1 IMPLANT
TOWEL OR 17X26 10 PK STRL BLUE (TOWEL DISPOSABLE) ×4 IMPLANT
TRAY FOLEY CATH 14FRSI W/METER (CATHETERS) ×2 IMPLANT
WATER STERILE IRR 1500ML POUR (IV SOLUTION) IMPLANT
YANKAUER SUCT BULB TIP NO VENT (SUCTIONS) ×2 IMPLANT

## 2012-10-31 NOTE — Anesthesia Preprocedure Evaluation (Signed)
Anesthesia Evaluation  Patient identified by MRN, date of birth, ID band Patient awake    Reviewed: Allergy & Precautions, H&P , NPO status , Patient's Chart, lab work & pertinent test results  Airway Mallampati: II  Neck ROM: Full    Dental  (+) Teeth Intact, Dental Advisory Given and Caps   Pulmonary neg pulmonary ROS, former smoker,  breath sounds clear to auscultation  Pulmonary exam normal       Cardiovascular hypertension, Rhythm:Regular Rate:Normal     Neuro/Psych Anxiety Depression negative neurological ROS  negative psych ROS   GI/Hepatic Neg liver ROS, GERD-  ,  Endo/Other  negative endocrine ROS  Renal/GU negative Renal ROS  negative genitourinary   Musculoskeletal negative musculoskeletal ROS (+)   Abdominal   Peds  Hematology negative hematology ROS (+)   Anesthesia Other Findings Caps/Bridge upper incisors.  Reproductive/Obstetrics                           Anesthesia Physical Anesthesia Plan  ASA: II  Anesthesia Plan: General   Post-op Pain Management:    Induction: Intravenous  Airway Management Planned: Oral ETT  Additional Equipment:   Intra-op Plan:   Post-operative Plan: Extubation in OR  Informed Consent: I have reviewed the patients History and Physical, chart, labs and discussed the procedure including the risks, benefits and alternatives for the proposed anesthesia with the patient or authorized representative who has indicated his/her understanding and acceptance.   Dental advisory given  Plan Discussed with: CRNA  Anesthesia Plan Comments:         Anesthesia Quick Evaluation
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
## 2012-10-31 NOTE — Op Note (Signed)
Preoperative Diagnosis: sigmoid diverticulitis with stricture and entercolonic fistula  Postoprative Diagnosis: sigmoid diverticulitis with stricture and entercolonic fistula  Procedure: Procedure(s): LEFT HEMI-COLECTOMY WITH TAKE DOW OF SPLENIC FLEXURE , REPAIR ENTEROCOLONIC FISTULA,REPAIR COLO-VAGINAL FISTULA    Surgeon: Mariella Saa   Assistants: Lendon Ka  Anesthesia:  General endotracheal anesthesia  Indications:   Patient is a 72 year old female whom I initially saw last year after several episodes of acute diverticulitis and clinically what sounded like a colovaginal fistula. She however was quite asymptomatic at that time and although we discussed options of surgical intervention she declined. After a recent episode of rectal bleeding she underwent colonoscopy with findings of a tight sigmoid colon stricture which could not be traversed with the colonoscope. Barium enema has confirmed a stricture in the mid sigmoid colon with clear fistulization to the ileum. She still is relatively asymptomatic but with these findings I recommended proceeding with sigmoid colectomy and repair of her fistula. We discussed options and risks of surgery including anesthetic complications, bleeding, infection, ureteral injury, anastomotic leak. She understands and agrees to proceed.  Procedure Detail:  The patient had undergone a mechanical and antibiotic bowel prep at home preoperatively. She is brought to the operating room and general endotracheal anesthesia induced. Foley catheter orogastric tube was placed. PAS were in place. She received subcutaneous heparin preoperatively as well as broad-spectrum IV antibiotics. She was carefully positioned in stirrups and the perineum and abdomen widely sterilely prepped and draped. Patient timeout was performed and correct procedure verified. The abdomen was explored through a low midline incision up to the umbilicus. There was a discrete area of  thickening and firmness in the mid sigmoid colon which felt most consistent with chronic diverticulitis. It was densely adherent to the posterior uterus and upper vagina as well as to a loop of ileum which was tightly adherent to the colon anteriorly. The remainder of the small bowel and retroperitoneum, liver, gallbladder, stomach all appeared normal. Initially the sigmoid colon was mobilized proximally dividing the peritoneum laterally and mobilizing the left colon or proximal sigmoid up out of the retroperitoneum. There was a fair amount of edema and chronic inflammation of the retroperitoneum were able to identify the left ureter which was tracking very close to the inflammatory process along the sigmoid mesentery just posterior to the uterus and adnexa. Initially the loop of small bowel was dissected off of the sigmoid colon as well as lying anteriorly and quite accessible. We came across about a 1/2 cm diameter fistula to the small intestine which was divided. The opening in the colon was controlled with a figure-of-eight suture of silk. The small intestine was carefully examined and there was just some mild secondary thickening but it appeared generally healthy and I elected to close this primarily. This was closed transversely with several full thickness interrupted 3-0 silk sutures. It was airtight to testing. The small bowel could then be packed into the upper abdomen. We then carefully mobilized the sigmoid colon off of the uterus working down toward the vagina. There were dense adhesions to the left adnexa. Using blunt and cautery dissection I mobilized the sigmoid distally down along the posterior uterus toward the vagina and came across a small abscess and second fistula to the vagina. We then came along the colon wall and mobilized the remainder of the uterus and adnexa off the sigmoid although the left fallopian tube was densely adherent to the colon and friable and was agitated from the uterus and  take  it with the bowel. We stayed right on the bowel wall and visualizing the ureter coming into the process stayed medial to this and mobilized off of the mesentery and finally freed the sigmoid completely from the uterus and vagina and inflammatory adhesions to its mesentery. The ureter couldn't be seen clearly did not appear injured. We did however give methylene blue intravenously as we had been working so closely to it through the inflammatory process and later through the case check this area there was no evidence of any leak. The distal rectosigmoid appeared normal. I initially chose an area for proximal resection in the distal left colon or proximal sigmoid and this was divided with the GIA 75 mm stapler. The mesentery of the sigmoid and rectosigmoid was then sequentially divided with the LigaSure were, ligating larger vessels taking the dissection down to the upper rectum which was cleared of mesentery and pericolic fat and divided with the contour stapler. The ureter was visualized and did not appear injured. The preparation for anastomosis we mobilized the left colon up toward the splenic flexure but with full mobilization up to the splenic flexure I thought there was still too much tension bringing the colon down to the pelvis for anastomosis. We therefore completely took down the splenic flexure. The incision was extended slightly above the umbilicus. The omentum was dissected off the transverse colon the lesser sac entered. The colon was mobilized off of the omentum and lesser sac opened out toward the splenic flexure. We dissected the lateral peritoneum proximally along the left colon further and finally came across the splenocolic ligament with the harmonic scalpel and the splenic flexure and transverse colon fully mobilized down into the lower abdomen. At this point I elected to perform the anastomosis at the transverse colon just distal to the middle colic vessels as this bowel was completely  healthy and reach down very easily into the pelvis. We therefore came across the transverse colon with the GIA 75 mm stapler and the mesentery of the distal transverse colon proximal left colon was divided with the LigaSure, ligating larger vessels, and this area the colon removed and sent for permanent pathology. I elected to use the EEA stapler with a Baker type of anastomosis from the rectum to the side of the transverse colon. The staple line was removed from the end of the transverse colon and the post of the anvil brought out through the antimesenteric border of the transverse colon. The end of the transverse colon was then closed with another firing of the GIA 75 mm stapler. Dr. Maisie Fus then went below and passed the 29 mm EEA stapler transanally up to the rectal staple line without difficulty and the spike was deployed just anterior to the little of the staple line. The anvil was connected and the stapler closed no extraneous tissue and healthy bowel wall 360. The anastomosis was created the stapler removed without difficulty. Dr. Maisie Fus then performed a rigid sigmoidoscopy and with the bowel clamp the tensely distended with air under saline irrigation there was no evidence of leak. At this point all instruments and drapes, gloves and gowns were changed. The abdomen was thoroughly irrigated and complete hemostasis assured. The anastomosis appeared healthy and was under no tension. The omentum came down into the pelvis and laid between the anastomosis and the uterus and upper vagina. The midline fascia was closed with running looped #1 PDS begun at either end the incision and tied centrally. The subcutaneous tissues irrigated and the skin closed  with staples. Sponge needle and instrument counts were correct.  Findings: Sigmoid colon diverticulitis fistula to the terminal ileum and vagina  Estimated Blood Loss:  350 ml         Drains: none  Blood Given: none          Specimens: sigmoid and left  colon        Complications:  * No complications entered in OR log *         Disposition: PACU - hemodynamically stable.         Condition: stable

## 2012-10-31 NOTE — Anesthesia Postprocedure Evaluation (Signed)
Anesthesia Post Note  Patient: Glenda Abbott  Procedure(s) Performed: Procedure(s) (LRB): LEFT HEMI-COLECTOMY , REPAIR ENTEROCOLONIC FISTULA,REPAIR COLO-VAGINAL FISTULA  (N/A)  Anesthesia type: General  Patient location: PACU  Post pain: Pain level controlled  Post assessment: Post-op Vital signs reviewed  Last Vitals:  Filed Vitals:   10/31/12 1715  BP: 170/71  Pulse: 69  Temp:   Resp: 12    Post vital signs: Reviewed  Level of consciousness: sedated  Complications: No apparent anesthesia complications

## 2012-10-31 NOTE — Transfer of Care (Signed)
Immediate Anesthesia Transfer of Care Note  Patient: Glenda Abbott  Procedure(s) Performed: Procedure(s): LEFT HEMI-COLECTOMY , REPAIR ENTEROCOLONIC FISTULA,REPAIR COLO-VAGINAL FISTULA  (N/A)  Patient Location: PACU  Anesthesia Type:General  Level of Consciousness: awake, alert , oriented and patient cooperative  Airway & Oxygen Therapy: Patient Spontanous Breathing and Patient connected to face mask oxygen  Post-op Assessment: Report given to PACU RN, Post -op Vital signs reviewed and stable and Patient moving all extremities  Post vital signs: Reviewed and stable  Complications: No apparent anesthesia complications

## 2012-11-01 ENCOUNTER — Encounter (HOSPITAL_COMMUNITY): Payer: Self-pay | Admitting: General Surgery

## 2012-11-01 ENCOUNTER — Ambulatory Visit: Payer: Self-pay | Admitting: Nurse Practitioner

## 2012-11-01 LAB — CBC
HCT: 43.7 % (ref 36.0–46.0)
MCH: 29 pg (ref 26.0–34.0)
MCV: 88.1 fL (ref 78.0–100.0)
RDW: 13.1 % (ref 11.5–15.5)
WBC: 15 10*3/uL — ABNORMAL HIGH (ref 4.0–10.5)

## 2012-11-01 LAB — BASIC METABOLIC PANEL
BUN: 8 mg/dL (ref 6–23)
CO2: 28 mEq/L (ref 19–32)
Chloride: 102 mEq/L (ref 96–112)
Creatinine, Ser: 0.76 mg/dL (ref 0.50–1.10)
Glucose, Bld: 178 mg/dL — ABNORMAL HIGH (ref 70–99)

## 2012-11-01 MED ORDER — ACETAMINOPHEN 10 MG/ML IV SOLN
1000.0000 mg | Freq: Four times a day (QID) | INTRAVENOUS | Status: AC
  Administered 2012-11-01 – 2012-11-02 (×4): 1000 mg via INTRAVENOUS
  Filled 2012-11-01 (×5): qty 100

## 2012-11-01 MED ORDER — ALPRAZOLAM 0.5 MG PO TABS
0.5000 mg | ORAL_TABLET | Freq: Every evening | ORAL | Status: DC | PRN
Start: 2012-11-01 — End: 2012-11-04
  Administered 2012-11-01 – 2012-11-03 (×3): 0.5 mg via ORAL
  Filled 2012-11-01 (×3): qty 1

## 2012-11-01 NOTE — Progress Notes (Signed)
Patient ID: Glenda Abbott, female   DOB: Oct 07, 1940, 72 y.o.   MRN: [id number] 1 Day Post-Op  Subjective: Quite a bit of pain, pain meds help for a short time.  No nausea or SOB.  Up walking in room  Objective: Vital signs in last 24 hours: Temp:  [97 F (36.1 C)-98.7 F (37.1 C)] 97 F (36.1 C) (04/15 0500) Pulse Rate:  [69-99] 99 (04/15 0500) Resp:  [11-18] 14 (04/15 0500) BP: (138-170)/(70-89) 165/76 mmHg (04/15 0500) SpO2:  [99 %-100 %] 99 % (04/15 0500) Weight:  [169 lb (76.658 kg)] 169 lb (76.658 kg) (04/14 1757) Last BM Date: 10/30/12  Intake/Output from previous day: 04/14 0701 - 04/15 0700 In: 4213.3 [I.V.:4213.3] Out: 1630 [Urine:1280; Blood:350] Intake/Output this shift:    General appearance: alert, cooperative and mild distress Resp: clear to auscultation bilaterally GI: Soft, non distended without significant tenderness Incision/Wound: Slight bloody drainage on bandage  Lab Results:   Recent Labs  11/01/12 0441  WBC 15.0*  HGB 14.4  HCT 43.7  PLT 310   BMET  Recent Labs  11/01/12 0441  NA 139  K 3.9  CL 102  CO2 28  GLUCOSE 178*  BUN 8  CREATININE 0.76  CALCIUM 8.4     Studies/Results: No results found.  Anti-infectives: Anti-infectives   Start     Dose/Rate Route Frequency Ordered Stop   10/31/12 1230  cefoTEtan (CEFOTAN) 2 g in dextrose 5 % 50 mL IVPB  Status:  Discontinued     2 g 100 mL/hr over 30 Minutes Intravenous  Once 10/31/12 1136 10/31/12 1814   10/31/12 0926  cefoTEtan (CEFOTAN) 2 g in dextrose 5 % 50 mL IVPB     2 g 100 mL/hr over 30 Minutes Intravenous On call to O.R. 10/31/12 0926 10/31/12 1235   10/31/12 0926  neomycin (MYCIFRADIN) tablet 1,000 mg  Status:  Discontinued     1,000 mg Oral 3 times per day 10/31/12 0926 10/31/12 1751   10/31/12 0926  erythromycin (E-MYCIN) tablet 1,000 mg  Status:  Discontinued     1,000 mg Oral 3 times per day 10/31/12 0926 10/31/12 1751      Assessment/Plan: s/p Procedure(s): LEFT  HEMI-COLECTOMY , REPAIR ENTEROCOLONIC FISTULA,REPAIR COLO-VAGINAL FISTULA  Stable post op.  Incisional pain not well controlled. Will add IV Tylenol Water PO   LOS: 1 day    Oliveah Zwack T 11/01/2012

## 2012-11-02 LAB — CBC
Hemoglobin: 13.5 g/dL (ref 12.0–15.0)
MCH: 29 pg (ref 26.0–34.0)
MCHC: 33.3 g/dL (ref 30.0–36.0)

## 2012-11-02 MED ORDER — OXYCODONE-ACETAMINOPHEN 5-325 MG PO TABS
1.0000 | ORAL_TABLET | ORAL | Status: DC | PRN
  Administered 2012-11-02 – 2012-11-03 (×4): 2 via ORAL
  Administered 2012-11-04: 1 via ORAL
  Administered 2012-11-04: 2 via ORAL
  Filled 2012-11-02 (×6): qty 2

## 2012-11-02 MED ORDER — METOPROLOL TARTRATE 1 MG/ML IV SOLN
5.0000 mg | Freq: Four times a day (QID) | INTRAVENOUS | Status: DC | PRN
  Filled 2012-11-02: qty 5

## 2012-11-02 MED FILL — Acetaminophen IV Soln 10 MG/ML: INTRAVENOUS | Qty: 100 | Status: AC

## 2012-11-02 NOTE — Progress Notes (Signed)
Patient ID: Glenda Abbott, female   DOB: 23-Jan-1941, 72 y.o.   MRN: [id number] 2 Days Post-Op  Subjective: Patient feels better today. He still has incisional pain when she tries to get up and down out of bed. She has had 2 loose bowel movements. Denies nausea.  Objective: Vital signs in last 24 hours: Temp:  [98 F (36.7 C)-98.9 F (37.2 C)] 98.3 F (36.8 C) (04/16 0605) Pulse Rate:  [84-107] 100 (04/16 0605) Resp:  [18-20] 18 (04/16 0605) BP: (137-167)/(63-91) 137/89 mmHg (04/16 0605) SpO2:  [94 %-96 %] 94 % (04/16 0605) Last BM Date: 10/30/12  Intake/Output from previous day: 04/15 0701 - 04/16 0700 In: 2650 [I.V.:2350; IV Piggyback:300] Out: 5600 [Urine:5600] Intake/Output this shift:    General appearance: alert, cooperative and no distress GI: normal findings: soft and nondistended with mild lower abdominal tenderness, no guarding Incision/Wound: dressing dry with a little old bloody drainage  Lab Results:   Recent Labs  11/01/12 0441 11/02/12 0418  WBC 15.0* 14.5*  HGB 14.4 13.5  HCT 43.7 40.6  PLT 310 292   BMET  Recent Labs  11/01/12 0441  NA 139  K 3.9  CL 102  CO2 28  GLUCOSE 178*  BUN 8  CREATININE 0.76  CALCIUM 8.4     Studies/Results: No results found.  Anti-infectives: Anti-infectives   Start     Dose/Rate Route Frequency Ordered Stop   10/31/12 1230  cefoTEtan (CEFOTAN) 2 g in dextrose 5 % 50 mL IVPB  Status:  Discontinued     2 g 100 mL/hr over 30 Minutes Intravenous  Once 10/31/12 1136 10/31/12 1814   10/31/12 0926  cefoTEtan (CEFOTAN) 2 g in dextrose 5 % 50 mL IVPB     2 g 100 mL/hr over 30 Minutes Intravenous On call to O.R. 10/31/12 0926 10/31/12 1235   10/31/12 0926  neomycin (MYCIFRADIN) tablet 1,000 mg  Status:  Discontinued     1,000 mg Oral 3 times per day 10/31/12 0926 10/31/12 1751   10/31/12 0926  erythromycin (E-MYCIN) tablet 1,000 mg  Status:  Discontinued     1,000 mg Oral 3 times per day 10/31/12 0926 10/31/12 1751       Assessment/Plan: s/p Procedure(s): LEFT HEMI-COLECTOMY , REPAIR ENTEROCOLONIC FISTULA,REPAIR COLO-VAGINAL FISTULA  Doing well without apparent complication. She has return of bowel function. Start clear liquid diet. Oral pain medications. Increase activity.   LOS: 2 days    [name] T 11/02/2012

## 2012-11-02 NOTE — Progress Notes (Signed)
Order for metoprolol PRN entered by Dr Josetta Huddle. Elis Sauber RN

## 2012-11-02 NOTE — Progress Notes (Signed)
Spoke to Dr Maisie Fus regarding patients BP due to patient being concerned of elevation. Patient takes lotrel 5/20mg  qdaily. Dr Maisie Fus advised to wait until am to discuss with Dr Johna Sheriff due to him may not want her to take a BP pill in oral form at this time. Will advise patient and continue to monitor BP and HR. Dr Maisie Fus stated to call if patient HR increases more than 114 or the norm it has been running. Shawnell Dykes RN

## 2012-11-03 LAB — CBC
MCH: 28.5 pg (ref 26.0–34.0)
MCV: 87.9 fL (ref 78.0–100.0)
Platelets: 256 10*3/uL (ref 150–400)
RDW: 13.2 % (ref 11.5–15.5)
WBC: 13.4 10*3/uL — ABNORMAL HIGH (ref 4.0–10.5)

## 2012-11-03 MED ORDER — BENAZEPRIL HCL 20 MG PO TABS
20.0000 mg | ORAL_TABLET | Freq: Every day | ORAL | Status: DC
  Administered 2012-11-03: 20 mg via ORAL
  Filled 2012-11-03 (×2): qty 1

## 2012-11-03 MED ORDER — AMLODIPINE BESYLATE 5 MG PO TABS
5.0000 mg | ORAL_TABLET | Freq: Every day | ORAL | Status: DC
  Administered 2012-11-03: 5 mg via ORAL
  Filled 2012-11-03 (×2): qty 1

## 2012-11-03 MED ORDER — AMLODIPINE BESY-BENAZEPRIL HCL 5-20 MG PO CAPS: 1.0000 | ORAL_CAPSULE | Freq: Every morning | ORAL | Status: DC

## 2012-11-03 NOTE — Progress Notes (Signed)
Patient ID: Glenda Abbott, female   DOB: 1941/06/15, 72 y.o.   MRN: [id number] 3 Days Post-Op  Subjective: Continues to feel better. Good pain control with Percocet. Tolerating clear liquid diet well.  Objective: Vital signs in last 24 hours: Temp:  [98.1 F (36.7 C)-99.8 F (37.7 C)] 98.3 F (36.8 C) (04/17 0545) Pulse Rate:  [86-114] 86 (04/17 0545) Resp:  [16-20] 18 (04/17 0545) BP: (122-179)/(76-106) 122/76 mmHg (04/17 0545) SpO2:  [95 %-96 %] 96 % (04/17 0545) Last BM Date: 11/03/12  Intake/Output from previous day: 04/16 0701 - 04/17 0700 In: 2695 [P.O.:1320; I.V.:1375] Out: 3550 [Urine:3550] Intake/Output this shift:    General appearance: alert, cooperative and no distress GI: normal findings: bowel sounds normal, soft, non-tender and Nondistended Incision/Wound: dressing clean and dry with slight old bloody drainage  Lab Results:   Recent Labs  11/02/12 0418 11/03/12 0429  WBC 14.5* 13.4*  HGB 13.5 12.2  HCT 40.6 37.6  PLT 292 256   BMET  Recent Labs  11/01/12 0441  NA 139  K 3.9  CL 102  CO2 28  GLUCOSE 178*  BUN 8  CREATININE 0.76  CALCIUM 8.4     Studies/Results: No results found.  Anti-infectives: Anti-infectives   Start     Dose/Rate Route Frequency Ordered Stop   10/31/12 1230  cefoTEtan (CEFOTAN) 2 g in dextrose 5 % 50 mL IVPB  Status:  Discontinued     2 g 100 mL/hr over 30 Minutes Intravenous  Once 10/31/12 1136 10/31/12 1814   10/31/12 0926  cefoTEtan (CEFOTAN) 2 g in dextrose 5 % 50 mL IVPB     2 g 100 mL/hr over 30 Minutes Intravenous On call to O.R. 10/31/12 0926 10/31/12 1235   10/31/12 0926  neomycin (MYCIFRADIN) tablet 1,000 mg  Status:  Discontinued     1,000 mg Oral 3 times per day 10/31/12 0926 10/31/12 1751   10/31/12 0926  erythromycin (E-MYCIN) tablet 1,000 mg  Status:  Discontinued     1,000 mg Oral 3 times per day 10/31/12 0926 10/31/12 1751      Assessment/Plan: s/p Procedure(s): LEFT HEMI-COLECTOMY , REPAIR  ENTEROCOLONIC FISTULA,REPAIR COLO-VAGINAL FISTULA  Doing well with no evidence of complication. Advanced to full liquid diet. Restart home medications.   LOS: 3 days    Ann Groeneveld T 11/03/2012

## 2012-11-04 LAB — CBC
Hemoglobin: 12.3 g/dL (ref 12.0–15.0)
MCHC: 32.9 g/dL (ref 30.0–36.0)
RDW: 13.2 % (ref 11.5–15.5)

## 2012-11-04 MED ORDER — OXYCODONE-ACETAMINOPHEN 5-325 MG PO TABS: 1.0000 | ORAL_TABLET | ORAL | Status: DC | PRN

## 2012-11-04 MED ORDER — ONDANSETRON HCL 4 MG PO TABS: 4.0000 mg | ORAL_TABLET | Freq: Three times a day (TID) | ORAL | Status: DC | PRN

## 2012-11-04 NOTE — Discharge Summary (Signed)
   Patient ID: Glenda Abbott [id number] 72 y.o. 09-25-1940  10/31/2012  Discharge date and time: 11/04/2012   Admitting Physician: Glenna Fellows T  Discharge Physician: Glenna Fellows T  Admission Diagnoses: sigmoid diverticulitis with stricture and entercolonic fistula  Discharge Diagnoses: sigmoid diverticulitis with stricture and entercolonic fistula and colovaginal fistula  Operations: Procedure(s): LEFT HEMI-COLECTOMY , REPAIR ENTEROCOLONIC FISTULA,REPAIR COLO-VAGINAL FISTULA   Admission Condition: fair  Discharged Condition: good  Indication for Admission: patient is a 72 year old female who has been having episodic lower abdominal pain. Subsequent workup included colonoscopy showing a fairly tight sigmoid colon stricture. Barium enema revealed diverticulosis and sigmoid stricture as well as a clear coloenteric fistula. She was also having some intermittent feculent vaginal drainage consistent with a colovaginal fistula. After extensive discussion detailed elsewhere she was electively admitted for sigmoid colectomy and repair of her fistulas.  Hospital Course: on the morning of admission the patient underwent an open sigmoid colectomy with findings of severe chronic diverticulitis with stricture and fistula to the ileum and the vagina. She had a primary anastomosis and primary repair of her small intestinal fistula. Her postoperative course was relatively smooth. She had quite a bit of incisional pain initially but this gradually improved. She received perioperative Entereg and had a bowel movement on the second postoperative day. She was started on clear liquids and tolerated this well. She was advanced to full liquids which he continued to tolerate well and her pain improved and she was switched to oral pain medications. She initially had a moderately elevated white count but steadily decreased to near normal through her hospitalization. On the day of discharge she is afebrile  with normal vital signs. Her pain is well-controlled. She is tolerating a full liquid diet well. She will advance her diet gradually at home. Her wound is healing nicely without evidence of infection. Her abdomen is soft and nontender. Pathology revealed diverticulitis with pericolonic abscess.  Consults: None  Disposition: Home  Patient Instructions:    Medication List    TAKE these medications       ALPRAZolam 0.5 MG tablet  Commonly known as:  XANAX  Take 0.25-0.5 mg by mouth at bedtime. May also take 1/2 tablet (0.25 mg) during the day for shakiness     amLODipine-benazepril 5-20 MG per capsule  Commonly known as:  LOTREL  Take 1 capsule by mouth every morning.     CALCIUM-VITAMIN D PO  Take 1 tablet by mouth daily.     FISH OIL PO  Take 1 capsule by mouth daily.     FOLIC ACID PO  Take 1 tablet by mouth daily.     ibuprofen 200 MG tablet  Commonly known as:  ADVIL,MOTRIN  Take 400 mg by mouth daily as needed. For pain     multivitamin with minerals Tabs  Take 1 tablet by mouth daily.     OVER THE COUNTER MEDICATION  Place 1 drop into both eyes daily as needed (for allergies). [employer] eye drop allergy relief     oxyCODONE-acetaminophen 5-325 MG per tablet  Commonly known as:  PERCOCET/ROXICET  Take 1-2 tablets by mouth every 4 (four) hours as needed.     VITAMIN B 12 PO  Take 1 tablet by mouth daily.        Activity: activity as tolerated Diet: regular diet Wound Care: none needed  Follow-up:  With Dr. Johna Sheriff in 1 week.  Signed: Mariella Saa MD, FACS  11/04/2012, 8:24 AM

## 2012-11-04 NOTE — Care Management Note (Signed)
    Page 1 of 1   11/04/2012     8:39:50 AM   CARE MANAGEMENT NOTE 11/04/2012  Patient:  Glenda Abbott, Glenda Abbott   Account Number:  [id number]  Date Initiated:  11/01/2012  Documentation initiated by:  Lorenda Ishihara  Subjective/Objective Assessment:   72 yo female admitted s/p hemicolectomy with fistula repairs. PTA live at home alone.     Action/Plan:   Home when stable   Anticipated DC Date:  11/04/2012   Anticipated DC Plan:  HOME/SELF CARE      DC Planning Services  CM consult      Choice offered to / List presented to:             Status of service:  Completed, signed off Medicare Important Message given?  NA - LOS <3 / Initial given by admissions (If response is "NO", the following Medicare IM given date fields will be blank) Date Medicare IM given:   Date Additional Medicare IM given:    Discharge Disposition:  HOME/SELF CARE  Per UR Regulation:  Reviewed for med. necessity/level of care/duration of stay  If discussed at Long Length of Stay Meetings, dates discussed:    Comments:

## 2012-11-04 NOTE — Progress Notes (Signed)
Patient discharged via wheelchair. Rx for percocet and zofran given. States understanding of discharge instrcutions.

## 2012-11-04 NOTE — Progress Notes (Signed)
Spoke to Dr. Johna Sheriff on floor, informed him patient wanting something for nausea, due to she gets some nausea with percocet. And that urine still green tinted from surgery.

## 2012-11-07 ENCOUNTER — Encounter (INDEPENDENT_AMBULATORY_CARE_PROVIDER_SITE_OTHER): Payer: Self-pay

## 2012-11-11 ENCOUNTER — Ambulatory Visit (INDEPENDENT_AMBULATORY_CARE_PROVIDER_SITE_OTHER): Payer: Medicare Other | Admitting: General Surgery

## 2012-11-11 ENCOUNTER — Encounter (INDEPENDENT_AMBULATORY_CARE_PROVIDER_SITE_OTHER): Payer: Self-pay | Admitting: General Surgery

## 2012-11-11 VITALS — BP 152/84 | HR 100 | Resp 16 | Ht 64.0 in | Wt 162.0 lb

## 2012-11-11 DIAGNOSIS — Z09 Encounter for follow-up examination after completed treatment for conditions other than malignant neoplasm: Secondary | ICD-10-CM

## 2012-11-11 NOTE — Progress Notes (Signed)
History: Patient returns to the office approaching 2 weeks following sigmoid colectomy and repair of coloenteric fistula and colovaginal fistula secondary to diverticulitis. She has some expected pain around her incision but overall is gradually improving. No fever or chills. Bowels are moving normally. Tolerating regular diet.  Exam: BP 152/84  Pulse 100  Resp 16  Ht 5\' 4"  (1.626 m)  Wt 162 lb (73.483 kg)  BMI 27.79 kg/m2 General: No distress Abdomen: Soft and nontender. Her wound is healing nicely.  Assessment and plan: Doing well following colectomy as above. I removed the remainder of her staples. We discussed activity limitations. She is to return in 3-4 weeks.

## 2012-11-16 ENCOUNTER — Telehealth (INDEPENDENT_AMBULATORY_CARE_PROVIDER_SITE_OTHER): Payer: Self-pay

## 2012-11-16 NOTE — Telephone Encounter (Signed)
Called patient with post op appointment for 12/08/12 @ 12:15 pm w/Dr. Johna Sheriff.  Patient states she's having bowel movements but they are very painful.  Patient states she has IBS so she would rather not take a stool softener. She wanted to know if she should take MOM. She states she seems to be having more pain than before her surgery.  (Patient s/p LEFT HEMI-COLECTOMY , REPAIR ENTEROCOLONIC FISTULA,REPAIR COLO-VAGINAL FISTULA on 10/31/12)  Please advise

## 2012-11-22 ENCOUNTER — Telehealth (INDEPENDENT_AMBULATORY_CARE_PROVIDER_SITE_OTHER): Payer: Self-pay

## 2012-11-22 NOTE — Telephone Encounter (Signed)
Called patient with recommendation of changing to stool softener vs. Laxative.  Patient reports that she currently takes fiber capsules and drinks prune juice.  She reports that she has more of a dull ache than pain.  Patient advised to call our office if her pain increases or her symptoms worsen. Patient to keep her follow up appointment on 12/08/12 w/Dr. Johna Sheriff and to call if she needs to be seen sooner.

## 2012-11-22 NOTE — Telephone Encounter (Signed)
Message copied by Maryan Puls on Tue Nov 22, 2012  8:46 AM ------      Message from: Glenna Fellows T      Created: Thu Nov 17, 2012  1:30 PM       Stool softener would be better than a harsh laxative. If she has any increasing pain she needs to get an earlier appointment. ------

## 2012-12-08 ENCOUNTER — Ambulatory Visit (INDEPENDENT_AMBULATORY_CARE_PROVIDER_SITE_OTHER): Payer: Medicare Other | Admitting: General Surgery

## 2012-12-08 ENCOUNTER — Encounter (INDEPENDENT_AMBULATORY_CARE_PROVIDER_SITE_OTHER): Payer: Self-pay | Admitting: General Surgery

## 2012-12-08 VITALS — BP 126/76 | HR 86 | Temp 97.4°F | Resp 14 | Ht 64.0 in | Wt 159.2 lb

## 2012-12-08 DIAGNOSIS — Z09 Encounter for follow-up examination after completed treatment for conditions other than malignant neoplasm: Secondary | ICD-10-CM

## 2012-12-08 NOTE — Progress Notes (Signed)
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 She feels she is doing well at this point. She has a little lower abdominal discomfort but is continuing to improve. Her bowels are moving regularly. No fever or chills or GI complaints.  Exam: BP 126/76  Pulse 86  Temp(Src) 97.4 F (36.3 C)  Resp 14  Ht 5\' 4"  (1.626 m)  Wt 159 lb 3.2 oz (72.213 kg)  BMI 27.31 kg/m2 General: No distress Abdomen: Soft and nontender. Her wound is well-healed  Assessment and plan: Doing well following colectomy as above. No complications identified. At this point she is released to full activity. I will see her back as needed for any problems or concerns.

## 2013-01-17 ENCOUNTER — Other Ambulatory Visit: Payer: Self-pay | Admitting: Internal Medicine

## 2013-02-08 DIAGNOSIS — G44209 Tension-type headache, unspecified, not intractable: Secondary | ICD-10-CM | POA: Diagnosis not present

## 2013-05-16 ENCOUNTER — Telehealth: Payer: Self-pay | Admitting: Internal Medicine

## 2013-05-16 NOTE — Telephone Encounter (Signed)
As we review the [facility] measures.  Glenda Abbott is past due on her Mammogram.  I called Glenda Abbott, says she know longer receive Mammograms, says because of her age and patient choice.  Discussed with Glenda Abbott the importance's of health maintenances. Says she understood,and was not going to have a mammogram.  Says also, she wanted to continue using [place] when she is sick because she does not need a physical.  Tried to explain to Glenda. Abbott again about a physical at least once a month.  Says she has not decided to transfer her care as of yet, however, I did tell Glenda Abbott that we are not urgent care- she understood.  Says she will call the office to schedule an appointment.   In talking with Glenda. Manson Abbott, she did not understand that our providers give medical care to  [facility]ursing Home patients as well as office visits. Explained to Glenda Abbott, her provider is not in the office all day, every day.  That she may have to see another provider when she is sick. However, for Physicals or follow up visit, we will schedule the next appointment with the provider, per the providers request.  Fyi for the chart only...cdavis

## 2013-05-24 ENCOUNTER — Other Ambulatory Visit: Payer: Self-pay | Admitting: Nurse Practitioner

## 2013-05-25 ENCOUNTER — Encounter: Payer: Self-pay | Admitting: *Deleted

## 2013-07-11 ENCOUNTER — Telehealth: Payer: Self-pay | Admitting: *Deleted

## 2013-07-11 ENCOUNTER — Other Ambulatory Visit: Payer: Self-pay | Admitting: *Deleted

## 2013-07-11 MED ORDER — AMLODIPINE BESY-BENAZEPRIL HCL 5-20 MG PO CAPS: 90.0000 | ORAL_CAPSULE | Freq: Every day | ORAL | Status: DC

## 2013-07-11 MED ORDER — ALPRAZOLAM 0.5 MG PO TABS: 0.2500 mg | ORAL_TABLET | Freq: Every day | ORAL | Status: DC

## 2013-07-11 NOTE — Telephone Encounter (Signed)
rx printed, signed, and given to pt. She has an appt in January for a f/u.

## 2013-07-11 NOTE — Telephone Encounter (Signed)
Spoke to [employer] and verbal given for the Lotrel take 1 tablet daily.

## 2013-07-21 ENCOUNTER — Ambulatory Visit: Payer: Self-pay | Admitting: Nurse Practitioner

## 2013-07-24 ENCOUNTER — Encounter: Payer: Self-pay | Admitting: *Deleted

## 2013-07-27 ENCOUNTER — Ambulatory Visit (INDEPENDENT_AMBULATORY_CARE_PROVIDER_SITE_OTHER): Payer: Medicare Other | Admitting: Nurse Practitioner

## 2013-07-27 ENCOUNTER — Encounter: Payer: Self-pay | Admitting: Nurse Practitioner

## 2013-07-27 VITALS — BP 140/80 | HR 101 | Temp 98.6°F | Wt 178.0 lb

## 2013-07-27 DIAGNOSIS — M1990 Unspecified osteoarthritis, unspecified site: Secondary | ICD-10-CM

## 2013-07-27 DIAGNOSIS — F341 Dysthymic disorder: Secondary | ICD-10-CM | POA: Diagnosis not present

## 2013-07-27 DIAGNOSIS — E785 Hyperlipidemia, unspecified: Secondary | ICD-10-CM

## 2013-07-27 DIAGNOSIS — I10 Essential (primary) hypertension: Secondary | ICD-10-CM | POA: Diagnosis not present

## 2013-07-27 DIAGNOSIS — F419 Anxiety disorder, unspecified: Secondary | ICD-10-CM

## 2013-07-27 DIAGNOSIS — F329 Major depressive disorder, single episode, unspecified: Secondary | ICD-10-CM

## 2013-07-27 DIAGNOSIS — K5732 Diverticulitis of large intestine without perforation or abscess without bleeding: Secondary | ICD-10-CM
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
## 2013-07-27 NOTE — Patient Instructions (Signed)
Will get blood work today  Will start Viibryd titration pack  Call us if you experience any unwanted non-tolerated side effects   Follow up in 1 month

## 2013-07-28 LAB — COMPREHENSIVE METABOLIC PANEL
A/G RATIO: 1.7 (ref 1.1–2.5)
ALT: 18 IU/L (ref 0–32)
AST: 13 IU/L (ref 0–40)
Albumin: 4.5 g/dL (ref 3.5–4.8)
Alkaline Phosphatase: 84 IU/L (ref 39–117)
BUN/Creatinine Ratio: 23 (ref 11–26)
BUN: 17 mg/dL (ref 8–27)
CO2: 26 mmol/L (ref 18–29)
Calcium: 9.8 mg/dL (ref 8.6–10.2)
Chloride: 104 mmol/L (ref 97–108)
Creatinine, Ser: 0.73 mg/dL (ref 0.57–1.00)
GFR, EST AFRICAN AMERICAN: 96 mL/min/{1.73_m2} (ref >59)
GFR, EST NON AFRICAN AMERICAN: 84 mL/min/{1.73_m2} (ref >59)
GLUCOSE: 100 mg/dL — AB (ref 65–99)
Globulin, Total: 2.6 g/dL (ref 1.5–4.5)
Potassium: 4 mmol/L (ref 3.5–5.2)
Sodium: 147 mmol/L — ABNORMAL HIGH (ref 134–144)
TOTAL PROTEIN: 7.1 g/dL (ref 6.0–8.5)

## 2013-07-31 DIAGNOSIS — F329 Major depressive disorder, single episode, unspecified: Secondary | ICD-10-CM | POA: Insufficient documentation

## 2013-07-31 DIAGNOSIS — E782 Mixed hyperlipidemia: Secondary | ICD-10-CM | POA: Insufficient documentation

## 2013-07-31 DIAGNOSIS — M1990 Unspecified osteoarthritis, unspecified site: Secondary | ICD-10-CM | POA: Insufficient documentation

## 2013-07-31 DIAGNOSIS — E785 Hyperlipidemia, unspecified: Secondary | ICD-10-CM | POA: Insufficient documentation

## 2013-07-31 DIAGNOSIS — F419 Anxiety disorder, unspecified: Secondary | ICD-10-CM

## 2013-08-31 ENCOUNTER — Ambulatory Visit: Payer: PRIVATE HEALTH INSURANCE | Admitting: Nurse Practitioner

## 2013-09-07 ENCOUNTER — Ambulatory Visit: Payer: PRIVATE HEALTH INSURANCE | Admitting: Nurse Practitioner

## 2013-10-29 IMAGING — CT CT ABD-PELV W/ CM
2 of 5 series · 16 of 46 positions shown, 18 images · IV contrast (water/omni  & 100ml omni 300)
Comparison: {None.}

CLINICAL DATA: [Left lower quadrant abdominal pain]

CT ABDOMEN AND PELVIS WITH CONTRAST
TECHNIQUE: Multidetector CT imaging of the abdomen and pelvis was
performed following the standard protocol during bolus
administration of intravenous contrast.
Contrast: 100mL OMNIPAQUE IOHEXOL 300 MG/ML  SOLN [...]

[Series 2: routine abdomen · axial · 0.70mm/px · z∈[-444,-44]mm · 13 of 90 slices shown, 15 images]
[im 5/90  soft-tissue]
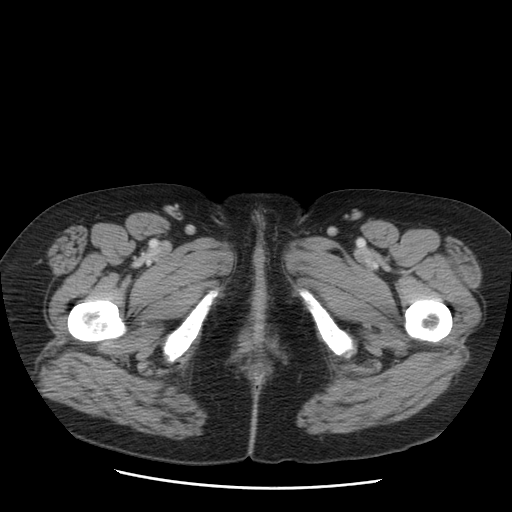
[im 5/90  bone]
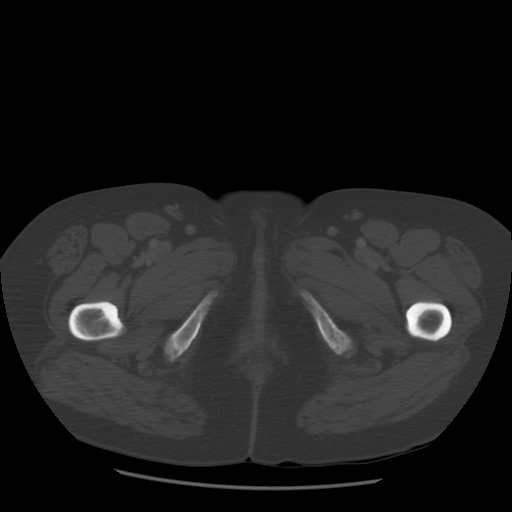
[im 10/90  soft-tissue]
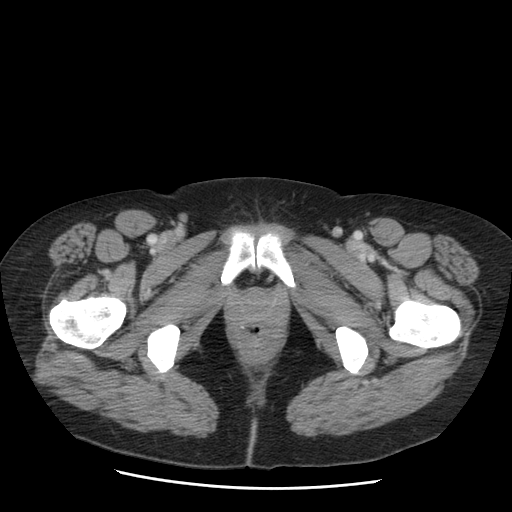
[im 20/90  soft-tissue]
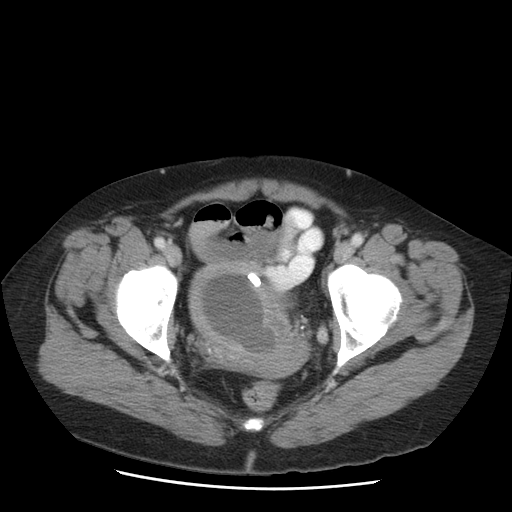
[im 25/90  soft-tissue]
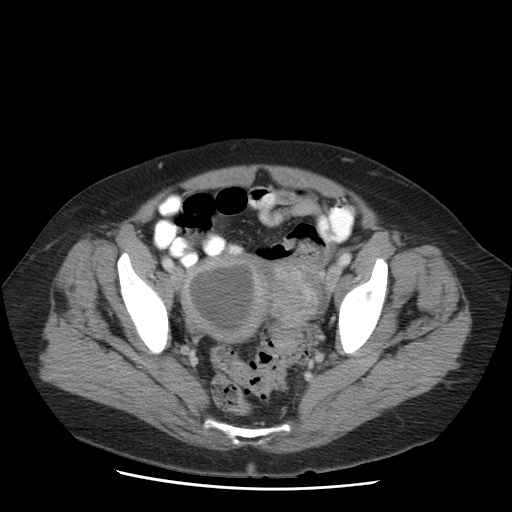
[im 30/90  soft-tissue]
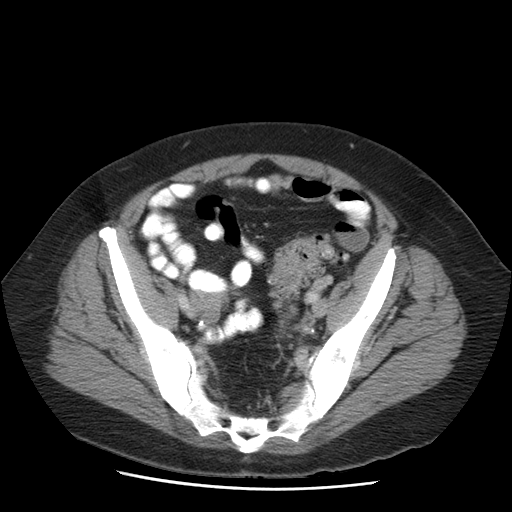
[im 40/90  soft-tissue]
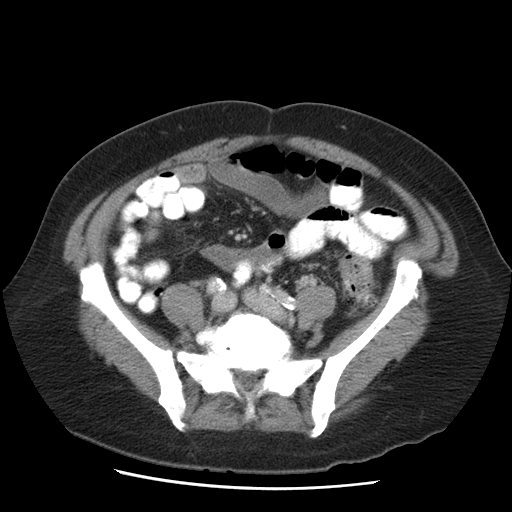
[im 45/90  soft-tissue]
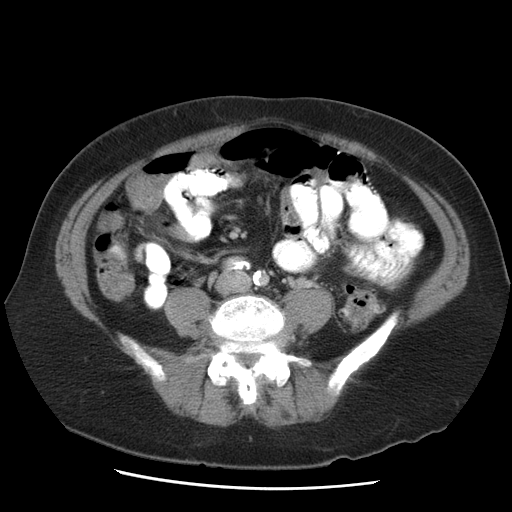
[im 50/90  soft-tissue]
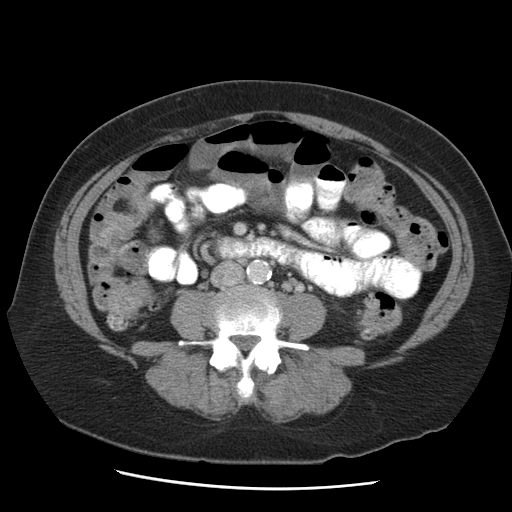
[im 60/90  soft-tissue]
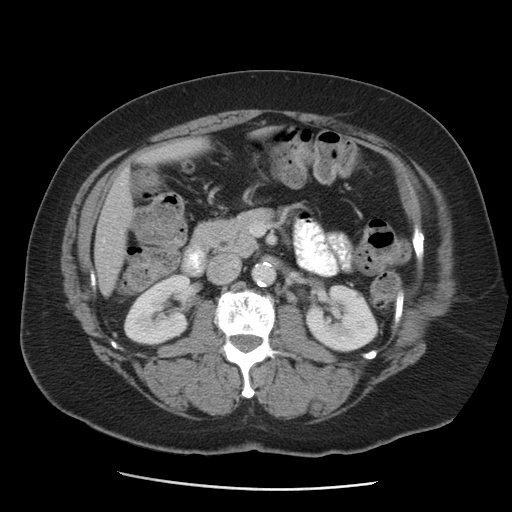
[im 60/90  bone]
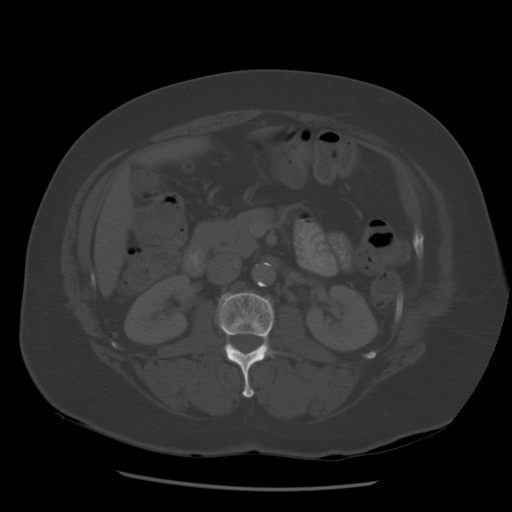
[im 65/90  soft-tissue]
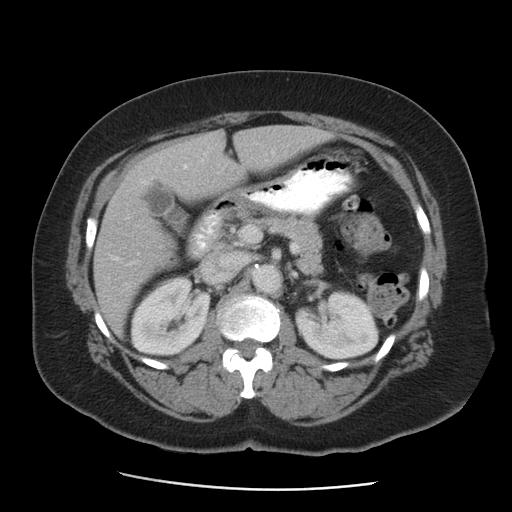
[im 70/90  soft-tissue]
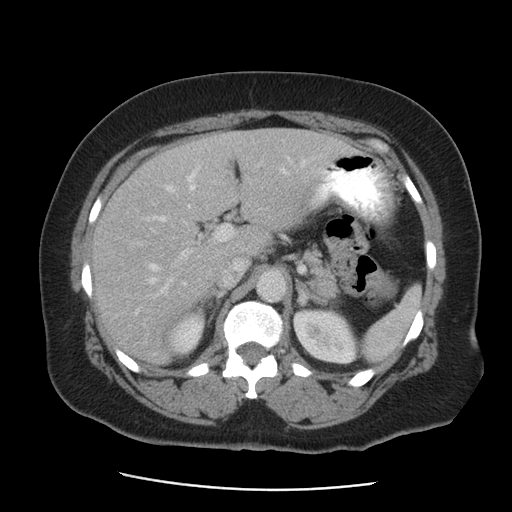
[im 80/90  soft-tissue]
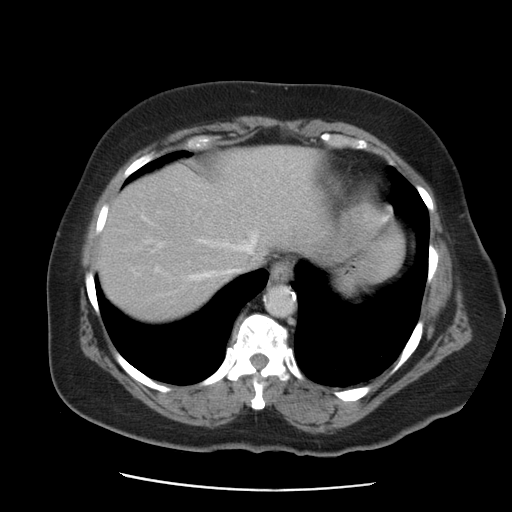
[im 85/90  soft-tissue]
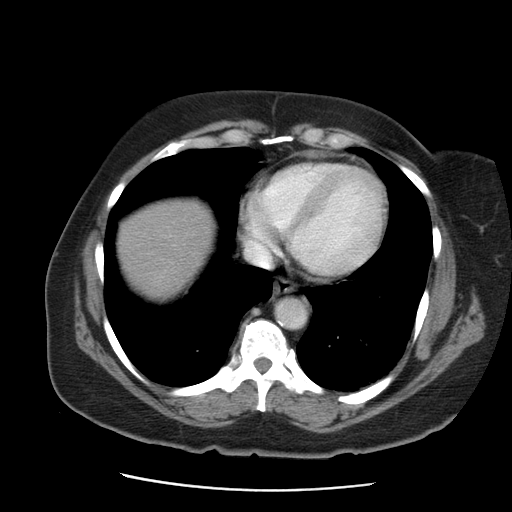

[Series 401: cor · coronal · 0.90mm/px · 3 of 96 slices shown]
[im 32/96  soft-tissue]
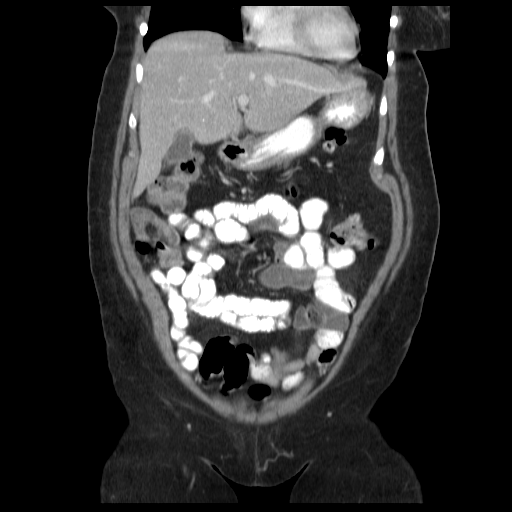
[im 43/96  soft-tissue]
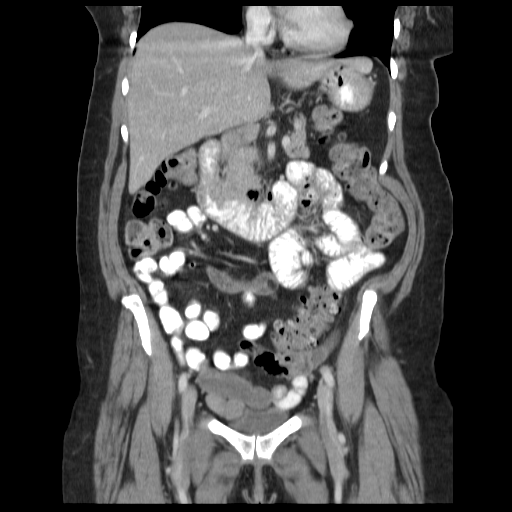
[im 53/96  soft-tissue]
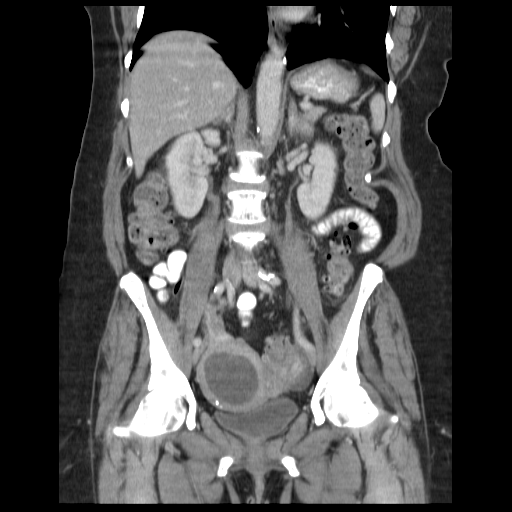

[16 of 46 positions shown; findings below may reference images not displayed]

FINDINGS: [Limited images through the lung bases demonstrate no
significant appreciable abnormality. The heart size is within
normal limits. No pleural or pericardial effusion.

Low attenuation of the liver is nonspecific post contrast however
suggests fatty infiltration. Punctate calcifications are in keeping
with sequelae of prior granulomatous infection.  Unremarkable
biliary system], spleen, pancreas. Bilateral adreniform enlargement
without a dominant/measurable nodule.

Relatively symmetric renal enhancement.  No hydronephrosis or
hydroureter.  There are a couple tiny hypodensities on the left,
too small further characterize.

Colon diverticulosis. A segment of sigmoid colon appears thick-
walled and is inseparable from the left adnexa (series 2, image
67).  There is mild adjacent stranding and fluid.  Normal appendix.
No bowel obstruction.  No free intraperitoneal air.

The endometrial cavity is distended with fluid.  There are coarse
calcifications within the uterine parenchyma. See description of
the left adnexa above.  No right adnexal abnormality.

There is scattered atherosclerotic calcification of the aorta and
its branches. No aneurysmal dilatation.

Multilevel degenerative changes of the imaged spine. No acute or
aggressive appearing osseous lesion. Grade 1 anterolisthesis of L4-
L5.
IMPRESSION: [Mild fat stranding / fluid in the left lower quadrant abuts the
left ovary and a segment of sigmoid colon.  Given the underlying
diverticulosis, diverticulitis is a consideration.] Colonoscopy
follow-up is recommended after symptoms resolve to exclude an
underlying mass.

Fluid distended endometrium is nonspecific.  Recommend pelvic
ultrasound to further evaluate this finding as well as the left
adnexa.

## 2013-11-08 ENCOUNTER — Other Ambulatory Visit: Payer: Self-pay | Admitting: *Deleted

## 2013-11-08 ENCOUNTER — Other Ambulatory Visit: Payer: Self-pay | Admitting: Internal Medicine

## 2013-11-08 MED ORDER — ALPRAZOLAM 0.5 MG PO TABS: 0.2500 mg | ORAL_TABLET | Freq: Every day | ORAL | Status: DC

## 2013-11-08 MED ORDER — AMLODIPINE BESY-BENAZEPRIL HCL 5-20 MG PO CAPS: ORAL_CAPSULE | ORAL | Status: DC

## 2014-02-12 ENCOUNTER — Telehealth: Payer: Self-pay | Admitting: Internal Medicine

## 2014-02-26 ENCOUNTER — Ambulatory Visit (INDEPENDENT_AMBULATORY_CARE_PROVIDER_SITE_OTHER): Payer: Medicare Other | Admitting: Internal Medicine

## 2014-02-26 ENCOUNTER — Encounter: Payer: Self-pay | Admitting: Internal Medicine

## 2014-02-26 VITALS — BP 128/76 | HR 98 | Temp 98.9°F | Ht 64.0 in | Wt 184.0 lb

## 2014-02-26 DIAGNOSIS — M159 Polyosteoarthritis, unspecified: Secondary | ICD-10-CM | POA: Diagnosis not present

## 2014-02-26 DIAGNOSIS — I10 Essential (primary) hypertension: Secondary | ICD-10-CM | POA: Diagnosis not present

## 2014-02-26 DIAGNOSIS — F341 Dysthymic disorder: Secondary | ICD-10-CM | POA: Diagnosis not present

## 2014-02-26 DIAGNOSIS — M47812 Spondylosis without myelopathy or radiculopathy, cervical region: Secondary | ICD-10-CM

## 2014-02-26 DIAGNOSIS — IMO0002 Reserved for concepts with insufficient information to code with codable children: Secondary | ICD-10-CM | POA: Diagnosis not present

## 2014-02-26 DIAGNOSIS — R635 Abnormal weight gain: Secondary | ICD-10-CM | POA: Diagnosis not present

## 2014-02-26 DIAGNOSIS — M792 Neuralgia and neuritis, unspecified: Secondary | ICD-10-CM

## 2014-02-26 DIAGNOSIS — R7309 Other abnormal glucose: Secondary | ICD-10-CM

## 2014-02-26 DIAGNOSIS — R5381 Other malaise: Secondary | ICD-10-CM

## 2014-02-26 DIAGNOSIS — M8949 Other hypertrophic osteoarthropathy, multiple sites: Secondary | ICD-10-CM

## 2014-02-26 DIAGNOSIS — R739 Hyperglycemia, unspecified: Secondary | ICD-10-CM

## 2014-02-26 DIAGNOSIS — F32A Depression, unspecified: Secondary | ICD-10-CM

## 2014-02-26 DIAGNOSIS — E785 Hyperlipidemia, unspecified: Secondary | ICD-10-CM

## 2014-02-26 DIAGNOSIS — F329 Major depressive disorder, single episode, unspecified: Secondary | ICD-10-CM

## 2014-02-26 DIAGNOSIS — M150 Primary generalized (osteo)arthritis: Secondary | ICD-10-CM

## 2014-02-26 DIAGNOSIS — M4722 Other spondylosis with radiculopathy, cervical region: Secondary | ICD-10-CM

## 2014-02-26 DIAGNOSIS — F419 Anxiety disorder, unspecified: Principal | ICD-10-CM

## 2014-02-26 DIAGNOSIS — L80 Vitiligo: Secondary | ICD-10-CM

## 2014-02-26 DIAGNOSIS — R5383 Other fatigue: Secondary | ICD-10-CM
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
## 2014-02-27 LAB — HEMOGLOBIN A1C
Est. average glucose Bld gHb Est-mCnc: 146 mg/dL
Hgb A1c MFr Bld: 6.7 % — ABNORMAL HIGH (ref 4.8–5.6)

## 2014-02-27 LAB — TSH: TSH: 1.43 u[IU]/mL (ref 0.450–4.500)

## 2014-02-28 ENCOUNTER — Encounter: Payer: Self-pay | Admitting: *Deleted

## 2014-02-28 NOTE — Progress Notes (Signed)
Quick Note:  Thyroid was normal. Sugar average is in diabetic range. She needs to quit the m&m s. I recommend she cut back on sweets and starches and exercise more to prevent her sugar from going up more (in which case she would need meds) ______

## 2014-05-04 ENCOUNTER — Other Ambulatory Visit: Payer: Self-pay

## 2014-05-04 ENCOUNTER — Other Ambulatory Visit: Payer: Self-pay | Admitting: Internal Medicine

## 2014-05-04 MED ORDER — ALPRAZOLAM 0.5 MG PO TABS: 0.2500 mg | ORAL_TABLET | Freq: Every day | ORAL | Status: DC

## 2014-07-06 ENCOUNTER — Other Ambulatory Visit: Payer: Self-pay | Admitting: Internal Medicine

## 2014-07-06 ENCOUNTER — Other Ambulatory Visit: Payer: Self-pay | Admitting: *Deleted

## 2014-07-06 MED ORDER — ALPRAZOLAM 0.5 MG PO TABS: ORAL_TABLET | ORAL | Status: DC

## 2014-07-06 NOTE — Telephone Encounter (Signed)
Patient requested and phoned to pharmacy

## 2014-08-28 ENCOUNTER — Other Ambulatory Visit: Payer: Self-pay | Admitting: *Deleted

## 2014-08-28 MED ORDER — ALPRAZOLAM 0.5 MG PO TABS: ORAL_TABLET | ORAL | Status: DC

## 2014-08-28 NOTE — Telephone Encounter (Signed)
Patient requested a refill and will call back to schedule a follow up appointment. Has out of town guest at the moment.

## 2014-11-14 ENCOUNTER — Other Ambulatory Visit: Payer: Self-pay | Admitting: Internal Medicine

## 2015-03-13 ENCOUNTER — Other Ambulatory Visit: Payer: Self-pay | Admitting: Internal Medicine

## 2015-05-11 ENCOUNTER — Emergency Department (HOSPITAL_COMMUNITY)
Admission: EM | Admit: 2015-05-11 | Discharge: 2015-05-11 | Disposition: A | Discharge status: Home / Self Care | Payer: Medicare Other | Attending: Emergency Medicine | Admitting: Emergency Medicine

## 2015-05-11 ENCOUNTER — Emergency Department (HOSPITAL_COMMUNITY): Payer: Medicare Other

## 2015-05-11 ENCOUNTER — Encounter (HOSPITAL_COMMUNITY): Payer: Self-pay | Admitting: Emergency Medicine

## 2015-05-11 ENCOUNTER — Emergency Department (INDEPENDENT_AMBULATORY_CARE_PROVIDER_SITE_OTHER)
Admission: EM | Admit: 2015-05-11 | Discharge: 2015-05-11 | Disposition: A | Discharge status: Nursing Home (Includes ICF) | Payer: Medicare Other | Source: Home / Self Care | Attending: Family Medicine | Admitting: Family Medicine

## 2015-05-11 DIAGNOSIS — K5900 Constipation, unspecified: Secondary | ICD-10-CM | POA: Diagnosis not present

## 2015-05-11 DIAGNOSIS — Z8719 Personal history of other diseases of the digestive system: Secondary | ICD-10-CM | POA: Diagnosis not present

## 2015-05-11 DIAGNOSIS — R1031 Right lower quadrant pain: Secondary | ICD-10-CM

## 2015-05-11 DIAGNOSIS — F329 Major depressive disorder, single episode, unspecified: Secondary | ICD-10-CM | POA: Diagnosis not present

## 2015-05-11 DIAGNOSIS — Z79899 Other long term (current) drug therapy: Secondary | ICD-10-CM | POA: Diagnosis not present

## 2015-05-11 DIAGNOSIS — F419 Anxiety disorder, unspecified: Secondary | ICD-10-CM | POA: Diagnosis not present

## 2015-05-11 DIAGNOSIS — Z872 Personal history of diseases of the skin and subcutaneous tissue: Secondary | ICD-10-CM | POA: Diagnosis not present

## 2015-05-11 DIAGNOSIS — Z720 Tobacco use: Secondary | ICD-10-CM | POA: Diagnosis not present

## 2015-05-11 DIAGNOSIS — Z8639 Personal history of other endocrine, nutritional and metabolic disease: Secondary | ICD-10-CM | POA: Insufficient documentation

## 2015-05-11 DIAGNOSIS — Z8742 Personal history of other diseases of the female genital tract: Secondary | ICD-10-CM | POA: Diagnosis not present

## 2015-05-11 DIAGNOSIS — R1032 Left lower quadrant pain: Secondary | ICD-10-CM | POA: Diagnosis not present

## 2015-05-11 DIAGNOSIS — Z86012 Personal history of benign carcinoid tumor: Secondary | ICD-10-CM | POA: Insufficient documentation

## 2015-05-11 DIAGNOSIS — E785 Hyperlipidemia, unspecified: Secondary | ICD-10-CM | POA: Diagnosis not present

## 2015-05-11 DIAGNOSIS — M1990 Unspecified osteoarthritis, unspecified site: Secondary | ICD-10-CM | POA: Diagnosis not present

## 2015-05-11 DIAGNOSIS — I10 Essential (primary) hypertension: Secondary | ICD-10-CM | POA: Insufficient documentation

## 2015-05-11 LAB — CBC
HCT: 45.8 % (ref 36.0–46.0)
HEMOGLOBIN: 15.1 g/dL — AB (ref 12.0–15.0)
MCH: 29.8 pg (ref 26.0–34.0)
MCHC: 33 g/dL (ref 30.0–36.0)
MCV: 90.3 fL (ref 78.0–100.0)
PLATELETS: 265 10*3/uL (ref 150–400)
RBC: 5.07 MIL/uL (ref 3.87–5.11)
RDW: 13.7 % (ref 11.5–15.5)
WBC: 11.3 10*3/uL — AB (ref 4.0–10.5)

## 2015-05-11 LAB — URINALYSIS, ROUTINE W REFLEX MICROSCOPIC
Bilirubin Urine: NEGATIVE
GLUCOSE, UA: NEGATIVE mg/dL
KETONES UR: NEGATIVE mg/dL
LEUKOCYTES UA: NEGATIVE
NITRITE: NEGATIVE
PH: 7.5 (ref 5.0–8.0)
PROTEIN: NEGATIVE mg/dL
Specific Gravity, Urine: 1.011 (ref 1.005–1.030)
Urobilinogen, UA: 0.2 mg/dL (ref 0.0–1.0)

## 2015-05-11 LAB — COMPREHENSIVE METABOLIC PANEL
ALBUMIN: 4.2 g/dL (ref 3.5–5.0)
ALK PHOS: 76 U/L (ref 38–126)
ALT: 27 U/L (ref 14–54)
AST: 23 U/L (ref 15–41)
Anion gap: 9 (ref 5–15)
BUN: 14 mg/dL (ref 6–20)
CO2: 27 mmol/L (ref 22–32)
CREATININE: 0.99 mg/dL (ref 0.44–1.00)
Calcium: 10.1 mg/dL (ref 8.9–10.3)
Chloride: 106 mmol/L (ref 101–111)
GFR calc Af Amer: >60 mL/min (ref >60)
GFR calc non Af Amer: 56 mL/min — ABNORMAL LOW (ref >60)
Glucose, Bld: 112 mg/dL — ABNORMAL HIGH (ref 65–99)
Potassium: 3.8 mmol/L (ref 3.5–5.1)
SODIUM: 142 mmol/L (ref 135–145)
Total Bilirubin: 0.5 mg/dL (ref 0.3–1.2)
Total Protein: 7.4 g/dL (ref 6.5–8.1)

## 2015-05-11 LAB — URINE MICROSCOPIC-ADD ON

## 2015-05-11 LAB — LIPASE, BLOOD: Lipase: 29 U/L (ref 11–51)

## 2015-05-11 LAB — POC OCCULT BLOOD, ED: Fecal Occult Bld: NEGATIVE

## 2015-05-11 MED ORDER — IOHEXOL 300 MG/ML  SOLN
100.0000 mL | Freq: Once | INTRAMUSCULAR | Status: AC | PRN
  Administered 2015-05-11: 100 mL via INTRAVENOUS

## 2015-05-11 MED ORDER — ONDANSETRON HCL 4 MG PO TABS: 4.0000 mg | ORAL_TABLET | Freq: Four times a day (QID) | ORAL | Status: DC | PRN

## 2015-05-11 MED ORDER — ONDANSETRON HCL 4 MG/2ML IJ SOLN: 4.0000 mg | Freq: Once | INTRAMUSCULAR | Status: DC

## 2015-05-11 MED ORDER — FENTANYL CITRATE (PF) 100 MCG/2ML IJ SOLN
25.0000 ug | Freq: Once | INTRAMUSCULAR | Status: DC
Start: 2015-05-11 — End: 2015-05-11

## 2015-05-11 MED ORDER — SODIUM CHLORIDE 0.9 % IV BOLUS (SEPSIS)
1000.0000 mL | Freq: Once | INTRAVENOUS | Status: AC
  Administered 2015-05-11: 1000 mL via INTRAVENOUS

## 2015-05-11 MED ORDER — IOHEXOL 300 MG/ML  SOLN
25.0000 mL | Freq: Once | INTRAMUSCULAR | Status: AC | PRN
  Administered 2015-05-11: 25 mL via ORAL
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
## 2015-05-11 NOTE — ED Notes (Signed)
Patient ambulated to the BR without difficulty

## 2015-05-11 NOTE — ED Notes (Addendum)
Glenda Quan RN from [facility] called report for pt presenting with left abdominal pain referred to ER for CT of abdomen. Pt in NAD, ambulatory with steady gait, no guarding.

## 2015-05-11 NOTE — ED Notes (Signed)
The patient presented to the [facility] with a complaint of a dull left lower quadrant pain that has been ongoing for about 7 days. The patient stated that she has had extensive abdominal surgical history in the past.

## 2015-05-11 NOTE — ED Notes (Signed)
Pt states left lower abd pain onset x4 days ago. Pt states that she has hx of diverticulitis and feels similar to previous flare ups. Pt denies N/V/D and no abnormal stools. Pt ambulatory to triage, a/o x4 with NAD noted.
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
## 2015-05-11 NOTE — ED Notes (Signed)
Discharge instructions and prescriptions reviewed - voiced understanding 

## 2015-05-11 NOTE — ED Notes (Signed)
Patient transported to CT 

## 2015-05-11 NOTE — Discharge Instructions (Signed)

## 2015-05-28 ENCOUNTER — Other Ambulatory Visit: Payer: Self-pay | Admitting: *Deleted

## 2015-05-28 MED ORDER — ALPRAZOLAM 0.5 MG PO TABS: ORAL_TABLET | ORAL | Status: DC

## 2015-05-28 MED ORDER — AMLODIPINE BESY-BENAZEPRIL HCL 5-20 MG PO CAPS: ORAL_CAPSULE | ORAL | Status: DC

## 2015-05-28 NOTE — Telephone Encounter (Signed)
Patient requested refill. Patient has not been seen in over a year. Appointment scheduled for 05/30/2015 with Janett Billow. Patient agreed.

## 2015-05-30 ENCOUNTER — Ambulatory Visit (INDEPENDENT_AMBULATORY_CARE_PROVIDER_SITE_OTHER): Payer: Medicare Other | Admitting: Nurse Practitioner

## 2015-05-30 ENCOUNTER — Encounter: Payer: Self-pay | Admitting: Nurse Practitioner

## 2015-05-30 VITALS — BP 140/68 | HR 101 | Temp 98.4°F | Resp 20 | Ht 64.0 in | Wt 193.8 lb

## 2015-05-30 DIAGNOSIS — F329 Major depressive disorder, single episode, unspecified: Secondary | ICD-10-CM

## 2015-05-30 DIAGNOSIS — E785 Hyperlipidemia, unspecified: Secondary | ICD-10-CM | POA: Diagnosis not present

## 2015-05-30 DIAGNOSIS — I10 Essential (primary) hypertension: Secondary | ICD-10-CM | POA: Diagnosis not present

## 2015-05-30 DIAGNOSIS — F418 Other specified anxiety disorders: Secondary | ICD-10-CM

## 2015-05-30 DIAGNOSIS — F419 Anxiety disorder, unspecified: Secondary | ICD-10-CM

## 2015-05-30 DIAGNOSIS — M4722 Other spondylosis with radiculopathy, cervical region: Secondary | ICD-10-CM

## 2015-05-30 DIAGNOSIS — R739 Hyperglycemia, unspecified: Secondary | ICD-10-CM
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
## 2015-08-06 ENCOUNTER — Other Ambulatory Visit: Payer: Self-pay | Admitting: *Deleted

## 2015-08-06 MED ORDER — ALPRAZOLAM 0.5 MG PO TABS: ORAL_TABLET | ORAL | Status: DC

## 2015-08-06 MED ORDER — AMLODIPINE BESY-BENAZEPRIL HCL 5-20 MG PO CAPS: ORAL_CAPSULE | ORAL | Status: DC

## 2015-08-06 NOTE — Telephone Encounter (Signed)
Patient requested refill for #60 on Alprazolam due to upcoming trip to [place] and she doesn't want to run out of

## 2015-10-21 ENCOUNTER — Other Ambulatory Visit: Payer: Self-pay | Admitting: *Deleted

## 2015-10-21 MED ORDER — ALPRAZOLAM 0.5 MG PO TABS: ORAL_TABLET | ORAL | Status: DC

## 2015-10-21 NOTE — Telephone Encounter (Signed)
Patient requested 

## 2015-12-26 ENCOUNTER — Telehealth: Payer: Self-pay

## 2015-12-26 ENCOUNTER — Other Ambulatory Visit: Payer: Self-pay

## 2015-12-26 MED ORDER — ALPRAZOLAM 0.5 MG PO TABS: ORAL_TABLET | ORAL | Status: DC

## 2015-12-26 NOTE — Telephone Encounter (Signed)
Patient called to have her xanax prescription refilled, I checked and it is due prescription will be faxed to pharmacy at patients request.

## 2015-12-31 ENCOUNTER — Other Ambulatory Visit: Payer: PRIVATE HEALTH INSURANCE

## 2016-01-01 ENCOUNTER — Other Ambulatory Visit: Payer: Medicare Other

## 2016-01-01 DIAGNOSIS — I10 Essential (primary) hypertension: Secondary | ICD-10-CM | POA: Diagnosis not present

## 2016-01-01 DIAGNOSIS — R739 Hyperglycemia, unspecified: Secondary | ICD-10-CM

## 2016-01-01 DIAGNOSIS — E785 Hyperlipidemia, unspecified: Secondary | ICD-10-CM

## 2016-01-02 ENCOUNTER — Ambulatory Visit (INDEPENDENT_AMBULATORY_CARE_PROVIDER_SITE_OTHER): Payer: Medicare Other | Admitting: Nurse Practitioner

## 2016-01-02 ENCOUNTER — Encounter: Payer: Self-pay | Admitting: Nurse Practitioner

## 2016-01-02 VITALS — BP 142/82 | HR 94 | Temp 98.3°F | Resp 18 | Ht 64.0 in | Wt 189.6 lb

## 2016-01-02 DIAGNOSIS — F329 Major depressive disorder, single episode, unspecified: Secondary | ICD-10-CM

## 2016-01-02 DIAGNOSIS — F418 Other specified anxiety disorders: Secondary | ICD-10-CM | POA: Diagnosis not present

## 2016-01-02 DIAGNOSIS — E119 Type 2 diabetes mellitus without complications: Secondary | ICD-10-CM | POA: Diagnosis not present

## 2016-01-02 DIAGNOSIS — E785 Hyperlipidemia, unspecified: Secondary | ICD-10-CM | POA: Diagnosis not present

## 2016-01-02 DIAGNOSIS — I10 Essential (primary) hypertension: Secondary | ICD-10-CM

## 2016-01-02 DIAGNOSIS — F419 Anxiety disorder, unspecified: Secondary | ICD-10-CM

## 2016-01-02 LAB — COMPREHENSIVE METABOLIC PANEL
ALBUMIN: 4.2 g/dL (ref 3.5–4.8)
ALT: 23 IU/L (ref 0–32)
AST: 17 IU/L (ref 0–40)
Albumin/Globulin Ratio: 1.6 (ref 1.2–2.2)
Alkaline Phosphatase: 82 IU/L (ref 39–117)
BILIRUBIN TOTAL: 0.3 mg/dL (ref 0.0–1.2)
BUN/Creatinine Ratio: 14 (ref 12–28)
BUN: 12 mg/dL (ref 8–27)
CHLORIDE: 100 mmol/L (ref 96–106)
CO2: 23 mmol/L (ref 18–29)
CREATININE: 0.85 mg/dL (ref 0.57–1.00)
Calcium: 9.1 mg/dL (ref 8.7–10.3)
GFR calc non Af Amer: 68 mL/min/{1.73_m2} (ref >59)
GFR, EST AFRICAN AMERICAN: 79 mL/min/{1.73_m2} (ref >59)
GLUCOSE: 102 mg/dL — AB (ref 65–99)
Globulin, Total: 2.7 g/dL (ref 1.5–4.5)
Potassium: 4 mmol/L (ref 3.5–5.2)
Sodium: 142 mmol/L (ref 134–144)
TOTAL PROTEIN: 6.9 g/dL (ref 6.0–8.5)

## 2016-01-02 LAB — CBC WITH DIFFERENTIAL/PLATELET
BASOS ABS: 0 10*3/uL (ref 0.0–0.2)
Basos: 0 %
EOS (ABSOLUTE): 0.2 10*3/uL (ref 0.0–0.4)
EOS: 2 %
HEMATOCRIT: 43 % (ref 34.0–46.6)
HEMOGLOBIN: 14.1 g/dL (ref 11.1–15.9)
IMMATURE GRANS (ABS): 0 10*3/uL (ref 0.0–0.1)
Immature Granulocytes: 0 %
LYMPHS: 31 %
Lymphocytes Absolute: 3.4 10*3/uL — ABNORMAL HIGH (ref 0.7–3.1)
MCH: 29.6 pg (ref 26.6–33.0)
MCHC: 32.8 g/dL (ref 31.5–35.7)
MCV: 90 fL (ref 79–97)
MONOCYTES: 8 %
Monocytes Absolute: 0.9 10*3/uL (ref 0.1–0.9)
NEUTROS ABS: 6.3 10*3/uL (ref 1.4–7.0)
Neutrophils: 59 %
Platelets: 269 10*3/uL (ref 150–379)
RBC: 4.76 x10E6/uL (ref 3.77–5.28)
RDW: 13.5 % (ref 12.3–15.4)
WBC: 10.9 10*3/uL — AB (ref 3.4–10.8)

## 2016-01-02 LAB — HEMOGLOBIN A1C
ESTIMATED AVERAGE GLUCOSE: 148 mg/dL
HEMOGLOBIN A1C: 6.8 % — AB (ref 4.8–5.6)

## 2016-01-02 NOTE — Patient Instructions (Signed)
Follow up in 7 months with lab work prior to visit- we will call you with cholesterol once it results  Make sure you follow up with ophthalmology
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
## 2016-01-04 LAB — LIPID PANEL
CHOLESTEROL TOTAL: 243 mg/dL — AB (ref 100–199)
Chol/HDL Ratio: 6.2 ratio units — ABNORMAL HIGH (ref 0.0–4.4)
HDL: 39 mg/dL — AB (ref >39)
LDL CALC: 160 mg/dL — AB (ref 0–99)
TRIGLYCERIDES: 220 mg/dL — AB (ref 0–149)
VLDL Cholesterol Cal: 44 mg/dL — ABNORMAL HIGH (ref 5–40)

## 2016-01-04 LAB — SPECIMEN STATUS REPORT

## 2016-01-06 MED ORDER — ATORVASTATIN CALCIUM 10 MG PO TABS: 10.0000 mg | ORAL_TABLET | Freq: Every day | ORAL | Status: DC

## 2016-01-06 NOTE — Addendum Note (Signed)
Addended by: Denyse Amass on: 01/06/2016 02:00 PM   Modules accepted: Orders, Medications

## 2016-03-25 ENCOUNTER — Other Ambulatory Visit: Payer: Self-pay | Admitting: *Deleted

## 2016-03-25 MED ORDER — ALPRAZOLAM 0.5 MG PO TABS: ORAL_TABLET | ORAL | 0 refills | Status: DC

## 2016-03-25 MED ORDER — AMLODIPINE BESY-BENAZEPRIL HCL 5-20 MG PO CAPS: ORAL_CAPSULE | ORAL | 1 refills | Status: DC

## 2016-03-25 NOTE — Telephone Encounter (Signed)
Patient requested refill to be faxed to pharmacy.  

## 2016-04-13 DIAGNOSIS — H5213 Myopia, bilateral: Secondary | ICD-10-CM | POA: Diagnosis not present

## 2016-04-13 DIAGNOSIS — H52203 Unspecified astigmatism, bilateral: Secondary | ICD-10-CM | POA: Diagnosis not present

## 2016-04-13 DIAGNOSIS — H25013 Cortical age-related cataract, bilateral: Secondary | ICD-10-CM | POA: Diagnosis not present

## 2016-04-13 DIAGNOSIS — H2513 Age-related nuclear cataract, bilateral: Secondary | ICD-10-CM | POA: Diagnosis not present

## 2016-06-02 ENCOUNTER — Other Ambulatory Visit: Payer: Self-pay | Admitting: *Deleted

## 2016-06-02 MED ORDER — ALPRAZOLAM 0.5 MG PO TABS: ORAL_TABLET | ORAL | 0 refills | Status: DC

## 2016-06-02 NOTE — Telephone Encounter (Signed)
Patient requested 

## 2016-08-05 ENCOUNTER — Other Ambulatory Visit: Payer: PRIVATE HEALTH INSURANCE

## 2016-08-06 ENCOUNTER — Ambulatory Visit: Payer: PRIVATE HEALTH INSURANCE | Admitting: Nurse Practitioner

## 2016-08-10 ENCOUNTER — Other Ambulatory Visit: Payer: PRIVATE HEALTH INSURANCE

## 2016-08-11 ENCOUNTER — Ambulatory Visit: Payer: PRIVATE HEALTH INSURANCE | Admitting: Nurse Practitioner

## 2016-08-28 ENCOUNTER — Ambulatory Visit (HOSPITAL_COMMUNITY)
Admission: EM | Admit: 2016-08-28 | Discharge: 2016-08-28 | Disposition: A | Discharge status: Home / Self Care | Payer: Medicare Other | Attending: Family Medicine | Admitting: Family Medicine

## 2016-08-28 ENCOUNTER — Encounter (HOSPITAL_COMMUNITY): Payer: Self-pay | Admitting: Emergency Medicine

## 2016-08-28 DIAGNOSIS — J40 Bronchitis, not specified as acute or chronic: Secondary | ICD-10-CM | POA: Diagnosis not present

## 2016-08-28 DIAGNOSIS — R05 Cough: Secondary | ICD-10-CM

## 2016-08-28 DIAGNOSIS — R059 Cough, unspecified: Secondary | ICD-10-CM

## 2016-08-28 MED ORDER — BENZONATATE 100 MG PO CAPS: 100.0000 mg | ORAL_CAPSULE | Freq: Three times a day (TID) | ORAL | 0 refills | Status: DC

## 2016-08-28 MED ORDER — AMOXICILLIN 875 MG PO TABS: 875.0000 mg | ORAL_TABLET | Freq: Two times a day (BID) | ORAL | 0 refills | Status: DC
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
## 2016-08-28 NOTE — ED Notes (Signed)
Discharged by B. Sallyanne Havers, NP

## 2016-08-28 NOTE — ED Triage Notes (Signed)
PT reports productive cough with green mucus, runny nose, post nasal drip, and fever since Tuesday.

## 2016-09-11 ENCOUNTER — Telehealth: Payer: Self-pay
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
## 2016-09-14 ENCOUNTER — Other Ambulatory Visit: Payer: Self-pay | Admitting: *Deleted

## 2016-09-14 MED ORDER — ALPRAZOLAM 0.5 MG PO TABS: ORAL_TABLET | ORAL | 0 refills | Status: DC

## 2016-09-14 NOTE — Telephone Encounter (Signed)
Patient requested refill. Patient has an upcoming appointment.

## 2016-09-22 ENCOUNTER — Other Ambulatory Visit: Payer: Self-pay

## 2016-09-22 DIAGNOSIS — I10 Essential (primary) hypertension: Secondary | ICD-10-CM

## 2016-09-22 DIAGNOSIS — E119 Type 2 diabetes mellitus without complications: Secondary | ICD-10-CM

## 2016-09-22 DIAGNOSIS — E785 Hyperlipidemia, unspecified: Secondary | ICD-10-CM

## 2016-09-23 ENCOUNTER — Other Ambulatory Visit: Payer: Medicare Other

## 2016-09-23 DIAGNOSIS — E785 Hyperlipidemia, unspecified: Secondary | ICD-10-CM | POA: Diagnosis not present

## 2016-09-23 DIAGNOSIS — E119 Type 2 diabetes mellitus without complications: Secondary | ICD-10-CM

## 2016-09-23 DIAGNOSIS — I10 Essential (primary) hypertension: Secondary | ICD-10-CM | POA: Diagnosis not present

## 2016-09-23 LAB — CBC WITH DIFFERENTIAL/PLATELET
BASOS ABS: 0 {cells}/uL (ref 0–200)
Basophils Relative: 0 %
EOS ABS: 206 {cells}/uL (ref 15–500)
EOS PCT: 2 %
HCT: 43.3 % (ref 35.0–45.0)
HEMOGLOBIN: 14.1 g/dL (ref 11.7–15.5)
LYMPHS ABS: 3090 {cells}/uL (ref 850–3900)
Lymphocytes Relative: 30 %
MCH: 28.5 pg (ref 27.0–33.0)
MCHC: 32.6 g/dL (ref 32.0–36.0)
MCV: 87.5 fL (ref 80.0–100.0)
MPV: 9.9 fL (ref 7.5–12.5)
Monocytes Absolute: 824 cells/uL (ref 200–950)
Monocytes Relative: 8 %
Neutro Abs: 6180 cells/uL (ref 1500–7800)
Neutrophils Relative %: 60 %
Platelets: 284 10*3/uL (ref 140–400)
RBC: 4.95 MIL/uL (ref 3.80–5.10)
RDW: 13.8 % (ref 11.0–15.0)
WBC: 10.3 10*3/uL (ref 3.8–10.8)

## 2016-09-23 LAB — LIPID PANEL
CHOL/HDL RATIO: 5.7 ratio — AB (ref <5.0)
CHOLESTEROL: 246 mg/dL — AB (ref <200)
HDL: 43 mg/dL — AB (ref >50)
LDL Cholesterol: 166 mg/dL — ABNORMAL HIGH (ref <100)
Triglycerides: 183 mg/dL — ABNORMAL HIGH (ref <150)
VLDL: 37 mg/dL — ABNORMAL HIGH (ref <30)

## 2016-09-23 LAB — HEMOGLOBIN A1C
Hgb A1c MFr Bld: 7.2 % — ABNORMAL HIGH (ref <5.7)
Mean Plasma Glucose: 160 mg/dL

## 2016-09-23 LAB — COMPLETE METABOLIC PANEL WITH GFR
ALBUMIN: 4.1 g/dL (ref 3.6–5.1)
ALK PHOS: 73 U/L (ref 33–130)
ALT: 25 U/L (ref 6–29)
AST: 20 U/L (ref 10–35)
BILIRUBIN TOTAL: 0.4 mg/dL (ref 0.2–1.2)
BUN: 15 mg/dL (ref 7–25)
CO2: 27 mmol/L (ref 20–31)
CREATININE: 0.84 mg/dL (ref 0.60–0.93)
Calcium: 9.6 mg/dL (ref 8.6–10.4)
Chloride: 105 mmol/L (ref 98–110)
GFR, EST NON AFRICAN AMERICAN: 69 mL/min (ref >=60)
GFR, Est African American: 80 mL/min (ref >=60)
GLUCOSE: 119 mg/dL — AB (ref 65–99)
Potassium: 4.4 mmol/L (ref 3.5–5.3)
SODIUM: 143 mmol/L (ref 135–146)
TOTAL PROTEIN: 7.2 g/dL (ref 6.1–8.1)

## 2016-09-24 ENCOUNTER — Ambulatory Visit (INDEPENDENT_AMBULATORY_CARE_PROVIDER_SITE_OTHER): Payer: Medicare Other | Admitting: Nurse Practitioner

## 2016-09-24 ENCOUNTER — Encounter: Payer: Self-pay | Admitting: Nurse Practitioner

## 2016-09-24 VITALS — BP 172/84 | HR 85 | Temp 98.1°F | Resp 17 | Ht 64.0 in | Wt 190.4 lb

## 2016-09-24 DIAGNOSIS — E119 Type 2 diabetes mellitus without complications: Secondary | ICD-10-CM | POA: Diagnosis not present

## 2016-09-24 DIAGNOSIS — E785 Hyperlipidemia, unspecified: Secondary | ICD-10-CM | POA: Insufficient documentation

## 2016-09-24 DIAGNOSIS — F419 Anxiety disorder, unspecified: Secondary | ICD-10-CM

## 2016-09-24 DIAGNOSIS — E782 Mixed hyperlipidemia: Secondary | ICD-10-CM | POA: Diagnosis not present

## 2016-09-24 DIAGNOSIS — F418 Other specified anxiety disorders: Secondary | ICD-10-CM

## 2016-09-24 DIAGNOSIS — I10 Essential (primary) hypertension: Secondary | ICD-10-CM | POA: Diagnosis not present

## 2016-09-24 DIAGNOSIS — F329 Major depressive disorder, single episode, unspecified: Secondary | ICD-10-CM

## 2016-09-24 MED ORDER — AMLODIPINE BESY-BENAZEPRIL HCL 10-40 MG PO CAPS: 1.0000 | ORAL_CAPSULE | Freq: Every day | ORAL | 2 refills | Status: DC

## 2016-09-24 NOTE — Patient Instructions (Signed)
Increase Lotrel to 2 tablets daily   DASH Eating Plan DASH stands for "Dietary Approaches to Stop Hypertension." The DASH eating plan is a healthy eating plan that has been shown to reduce high blood pressure (hypertension). It may also reduce your risk for type 2 diabetes, heart disease, and stroke. The DASH eating plan may also help with weight loss. What are tips for following this plan? General guidelines   Avoid eating more than 2,300 mg (milligrams) of salt (sodium) a day. If you have hypertension, you may need to reduce your sodium intake to 1,500 mg a day.  Limit alcohol intake to no more than 1 drink a day for nonpregnant women and 2 drinks a day for men. One drink equals 12 oz of beer, 5 oz of wine, or 1 oz of hard liquor.  Work with your health care provider to maintain a healthy body weight or to lose weight. Ask what an ideal weight is for you.  Get at least 30 minutes of exercise that causes your heart to beat faster (aerobic exercise) most days of the week. Activities may include walking, swimming, or biking.  Work with your health care provider or diet and nutrition specialist (dietitian) to adjust your eating plan to your individual calorie needs. Reading food labels   Check food labels for the amount of sodium per serving. Choose foods with less than 5 percent of the Daily Value of sodium. Generally, foods with less than 300 mg of sodium per serving fit into this eating plan.  To find whole grains, look for the word "whole" as the first word in the ingredient list. Shopping   Buy products labeled as "low-sodium" or "no salt added."  Buy fresh foods. Avoid canned foods and premade or frozen meals. Cooking   Avoid adding salt when cooking. Use salt-free seasonings or herbs instead of table salt or sea salt. Check with your health care provider or pharmacist before using salt substitutes.  Do not fry foods. Cook foods using healthy methods such as baking, boiling,  grilling, and broiling instead.  Cook with heart-healthy oils, such as olive, canola, soybean, or sunflower oil. Meal planning    Eat a balanced diet that includes:  5 or more servings of fruits and vegetables each day. At each meal, try to fill half of your plate with fruits and vegetables.  Up to 6-8 servings of whole grains each day.  Less than 6 oz of lean meat, poultry, or fish each day. A 3-oz serving of meat is about the same size as a deck of cards. One egg equals 1 oz.  2 servings of low-fat dairy each day.  A serving of nuts, seeds, or beans 5 times each week.  Heart-healthy fats. Healthy fats called Omega-3 fatty acids are found in foods such as flaxseeds and coldwater fish, like sardines, salmon, and mackerel.  Limit how much you eat of the following:  Canned or prepackaged foods.  Food that is high in trans fat, such as fried foods.  Food that is high in saturated fat, such as fatty meat.  Sweets, desserts, sugary drinks, and other foods with added sugar.  Full-fat dairy products.  Do not salt foods before eating.  Try to eat at least 2 vegetarian meals each week.  Eat more home-cooked food and less restaurant, buffet, and fast food.  When eating at a restaurant, ask that your food be prepared with less salt or no salt, if possible. What foods are recommended? The items  listed may not be a complete list. Talk with your dietitian about what dietary choices are best for you. Grains  Whole-grain or whole-wheat bread. Whole-grain or whole-wheat pasta. Balliet rice. Modena Morrow. Bulgur. Whole-grain and low-sodium cereals. Pita bread. Low-fat, low-sodium crackers. Whole-wheat flour tortillas. Vegetables  Fresh or frozen vegetables (raw, steamed, roasted, or grilled). Low-sodium or reduced-sodium tomato and vegetable juice. Low-sodium or reduced-sodium tomato sauce and tomato paste. Low-sodium or reduced-sodium canned vegetables. Fruits  All fresh, dried, or  frozen fruit. Canned fruit in natural juice (without added sugar). Meat and other protein foods  Skinless chicken or [place]. Ground chicken or [place]. Pork with fat trimmed off. Fish and seafood. Egg whites. Dried beans, peas, or lentils. Unsalted nuts, nut butters, and seeds. Unsalted canned beans. Lean cuts of beef with fat trimmed off. Low-sodium, lean deli meat. Dairy  Low-fat (1%) or fat-free (skim) milk. Fat-free, low-fat, or reduced-fat cheeses. Nonfat, low-sodium ricotta or cottage cheese. Low-fat or nonfat yogurt. Low-fat, low-sodium cheese. Fats and oils  Soft margarine without trans fats. Vegetable oil. Low-fat, reduced-fat, or light mayonnaise and salad dressings (reduced-sodium). Canola, safflower, olive, soybean, and sunflower oils. Avocado. Seasoning and other foods  Herbs. Spices. Seasoning mixes without salt. Unsalted popcorn and pretzels. Fat-free sweets. What foods are not recommended? The items listed may not be a complete list. Talk with your dietitian about what dietary choices are best for you. Grains  Baked goods made with fat, such as croissants, muffins, or some breads. Dry pasta or rice meal packs. Vegetables  Creamed or fried vegetables. Vegetables in a cheese sauce. Regular canned vegetables (not low-sodium or reduced-sodium). Regular canned tomato sauce and paste (not low-sodium or reduced-sodium). Regular tomato and vegetable juice (not low-sodium or reduced-sodium). Angie Fava. Olives. Fruits  Canned fruit in a light or heavy syrup. Fried fruit. Fruit in cream or butter sauce. Meat and other protein foods  Fatty cuts of meat. Ribs. Fried meat. Berniece Salines. Sausage. Bologna and other processed lunch meats. Salami. Fatback. Hotdogs. Bratwurst. Salted nuts and seeds. Canned beans with added salt. Canned or smoked fish. Whole eggs or egg yolks. Chicken or [place] with skin. Dairy  Whole or 2% milk, cream, and half-and-half. Whole or full-fat cream cheese. Whole-fat or sweetened  yogurt. Full-fat cheese. Nondairy creamers. Whipped toppings. Processed cheese and cheese spreads. Fats and oils  Butter. Stick margarine. Lard. Shortening. Ghee. Bacon fat. Tropical oils, such as coconut, palm kernel, or palm oil. Seasoning and other foods  Salted popcorn and pretzels. Onion salt, garlic salt, seasoned salt, table salt, and sea salt. Worcestershire sauce. Tartar sauce. Barbecue sauce. Teriyaki sauce. Soy sauce, including reduced-sodium. Steak sauce. Canned and packaged gravies. Fish sauce. Oyster sauce. Cocktail sauce. Horseradish that you find on the shelf. Ketchup. Mustard. Meat flavorings and tenderizers. Bouillon cubes. Hot sauce and Tabasco sauce. Premade or packaged marinades. Premade or packaged taco seasonings. Relishes. Regular salad dressings. Where to find more information:  National Heart, Lung, and [place]: https://wilson-eaton.com/  American Heart Association: www.heart.org Summary  The DASH eating plan is a healthy eating plan that has been shown to reduce high blood pressure (hypertension). It may also reduce your risk for type 2 diabetes, heart disease, and stroke.  With the DASH eating plan, you should limit salt (sodium) intake to 2,300 mg a day. If you have hypertension, you may need to reduce your sodium intake to 1,500 mg a day.  When on the DASH eating plan, aim to eat more fresh fruits and vegetables, whole grains, lean  proteins, low-fat dairy, and heart-healthy fats.  Work with your health care provider or diet and nutrition specialist (dietitian) to adjust your eating plan to your individual calorie needs. This information is not intended to replace advice given to you by your health care provider. Make sure you discuss any questions you have with your health care provider. Document Released: 06/25/2011 Document Revised: 06/29/2016 Document Reviewed: 06/29/2016 Elsevier Interactive Patient Education  2017 [employer].
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
## 2016-11-27 ENCOUNTER — Telehealth: Payer: Self-pay | Admitting: Nurse Practitioner

## 2016-11-27 NOTE — Telephone Encounter (Signed)
I called the pt to schedule her AWV and follow up appt with Janett Billow.  She stated that she wasn't feeling up to it right now because she just got back from [place] for another funeral.  She stated that she will call when she's ready to schedule an appt. VDM (DD)

## 2016-12-29 ENCOUNTER — Other Ambulatory Visit: Payer: Self-pay | Admitting: *Deleted

## 2016-12-29 DIAGNOSIS — I10 Essential (primary) hypertension: Secondary | ICD-10-CM

## 2016-12-29 MED ORDER — AMLODIPINE BESY-BENAZEPRIL HCL 10-40 MG PO CAPS: 1.0000 | ORAL_CAPSULE | Freq: Every day | ORAL | 2 refills | Status: DC

## 2016-12-29 MED ORDER — ALPRAZOLAM 0.5 MG PO TABS: ORAL_TABLET | ORAL | 0 refills | Status: DC

## 2016-12-29 NOTE — Telephone Encounter (Signed)
Patient requested refill to be faxed to [facility].

## 2016-12-31 ENCOUNTER — Encounter: Payer: Self-pay | Admitting: Nurse Practitioner

## 2016-12-31 ENCOUNTER — Ambulatory Visit (INDEPENDENT_AMBULATORY_CARE_PROVIDER_SITE_OTHER): Payer: Medicare Other | Admitting: Nurse Practitioner

## 2016-12-31 VITALS — BP 138/82 | HR 96 | Temp 97.8°F | Resp 18 | Ht 64.0 in | Wt 191.0 lb

## 2016-12-31 DIAGNOSIS — E782 Mixed hyperlipidemia: Secondary | ICD-10-CM

## 2016-12-31 DIAGNOSIS — E119 Type 2 diabetes mellitus without complications: Secondary | ICD-10-CM | POA: Diagnosis not present

## 2016-12-31 DIAGNOSIS — I10 Essential (primary) hypertension: Secondary | ICD-10-CM | POA: Diagnosis not present

## 2016-12-31 DIAGNOSIS — M1990 Unspecified osteoarthritis, unspecified site: Secondary | ICD-10-CM

## 2016-12-31 MED ORDER — AMLODIPINE BESY-BENAZEPRIL HCL 10-40 MG PO CAPS: 1.0000 | ORAL_CAPSULE | Freq: Every day | ORAL | 2 refills | Status: DC
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
## 2016-12-31 NOTE — Patient Instructions (Addendum)
To use tylenol (acetaminophen (tylenol) over aleve, Advil or ibuprofen   To follow up in 3 months with fasting blood work prior to visit.

## 2017-04-06 ENCOUNTER — Other Ambulatory Visit: Payer: Medicare Other

## 2017-04-06 DIAGNOSIS — E119 Type 2 diabetes mellitus without complications: Secondary | ICD-10-CM

## 2017-04-06 DIAGNOSIS — E782 Mixed hyperlipidemia: Secondary | ICD-10-CM

## 2017-04-06 DIAGNOSIS — I10 Essential (primary) hypertension: Secondary | ICD-10-CM | POA: Diagnosis not present

## 2017-04-07 LAB — COMPLETE METABOLIC PANEL WITH GFR
AG Ratio: 1.7 (calc) (ref 1.0–2.5)
ALBUMIN MSPROF: 4.2 g/dL (ref 3.6–5.1)
ALKALINE PHOSPHATASE (APISO): 77 U/L (ref 33–130)
ALT: 25 U/L (ref 6–29)
AST: 15 U/L (ref 10–35)
BILIRUBIN TOTAL: 0.4 mg/dL (ref 0.2–1.2)
BUN: 13 mg/dL (ref 7–25)
CHLORIDE: 104 mmol/L (ref 98–110)
CO2: 27 mmol/L (ref 20–32)
CREATININE: 0.93 mg/dL (ref 0.60–0.93)
Calcium: 9.3 mg/dL (ref 8.6–10.4)
GFR, Est African American: 70 mL/min/{1.73_m2} (ref >=60)
GFR, Est Non African American: 61 mL/min/{1.73_m2} (ref >=60)
GLOBULIN: 2.5 g/dL (ref 1.9–3.7)
Glucose, Bld: 143 mg/dL — ABNORMAL HIGH (ref 65–99)
Potassium: 4 mmol/L (ref 3.5–5.3)
SODIUM: 142 mmol/L (ref 135–146)
TOTAL PROTEIN: 6.7 g/dL (ref 6.1–8.1)

## 2017-04-07 LAB — LIPID PANEL
CHOL/HDL RATIO: 6.4 (calc) — AB (ref <5.0)
CHOLESTEROL: 262 mg/dL — AB (ref <200)
HDL: 41 mg/dL — ABNORMAL LOW (ref >50)
LDL Cholesterol (Calc): 180 mg/dL (calc) — ABNORMAL HIGH
Non-HDL Cholesterol (Calc): 221 mg/dL (calc) — ABNORMAL HIGH (ref <130)
Triglycerides: 216 mg/dL — ABNORMAL HIGH (ref <150)

## 2017-04-07 LAB — HEMOGLOBIN A1C
EAG (MMOL/L): 9.5 (calc)
Hgb A1c MFr Bld: 7.6 % of total Hgb — ABNORMAL HIGH (ref <5.7)
Mean Plasma Glucose: 171 (calc)

## 2017-04-08 ENCOUNTER — Encounter: Payer: Self-pay | Admitting: Nurse Practitioner

## 2017-04-08 ENCOUNTER — Ambulatory Visit (INDEPENDENT_AMBULATORY_CARE_PROVIDER_SITE_OTHER): Payer: Medicare Other | Admitting: Nurse Practitioner

## 2017-04-08 VITALS — BP 140/70 | HR 101 | Temp 98.2°F | Resp 20 | Ht 64.0 in | Wt 195.6 lb

## 2017-04-08 DIAGNOSIS — F419 Anxiety disorder, unspecified: Secondary | ICD-10-CM

## 2017-04-08 DIAGNOSIS — E782 Mixed hyperlipidemia: Secondary | ICD-10-CM | POA: Diagnosis not present

## 2017-04-08 DIAGNOSIS — E119 Type 2 diabetes mellitus without complications: Secondary | ICD-10-CM | POA: Diagnosis not present

## 2017-04-08 DIAGNOSIS — M1990 Unspecified osteoarthritis, unspecified site: Secondary | ICD-10-CM

## 2017-04-08 DIAGNOSIS — G8929 Other chronic pain: Secondary | ICD-10-CM

## 2017-04-08 DIAGNOSIS — I10 Essential (primary) hypertension: Secondary | ICD-10-CM

## 2017-04-08 DIAGNOSIS — M545 Low back pain: Secondary | ICD-10-CM | POA: Diagnosis not present

## 2017-04-08 MED ORDER — DULOXETINE HCL 30 MG PO CPEP: 30.0000 mg | ORAL_CAPSULE | Freq: Every day | ORAL | 3 refills | Status: DC

## 2017-04-08 MED ORDER — LINAGLIPTIN 5 MG PO TABS: 5.0000 mg | ORAL_TABLET | Freq: Every day | ORAL | 3 refills | Status: DC

## 2017-04-08 MED ORDER — EZETIMIBE 10 MG PO TABS: 10.0000 mg | ORAL_TABLET | Freq: Every day | ORAL | 3 refills | Status: DC
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
## 2017-04-08 NOTE — Patient Instructions (Addendum)
1) tradjenta 5 mg by mouth daily for diabetes 2) start zetia 10 mg by mouth daily for cholesterol  3) start cymbalta 30 mg daily Increase metamucil twice daily   To wait a week in between starting each medication so if you have a side effect you will know which one.   DASH Eating Plan DASH stands for "Dietary Approaches to Stop Hypertension." The DASH eating plan is a healthy eating plan that has been shown to reduce high blood pressure (hypertension). It may also reduce your risk for type 2 diabetes, heart disease, and stroke. The DASH eating plan may also help with weight loss. What are tips for following this plan? General guidelines  Avoid eating more than 2,300 mg (milligrams) of salt (sodium) a day. If you have hypertension, you may need to reduce your sodium intake to 1,500 mg a day.  Limit alcohol intake to no more than 1 drink a day for nonpregnant women and 2 drinks a day for men. One drink equals 12 oz of beer, 5 oz of wine, or 1 oz of hard liquor.  Work with your health care provider to maintain a healthy body weight or to lose weight. Ask what an ideal weight is for you.  Get at least 30 minutes of exercise that causes your heart to beat faster (aerobic exercise) most days of the week. Activities may include walking, swimming, or biking.  Work with your health care provider or diet and nutrition specialist (dietitian) to adjust your eating plan to your individual calorie needs. Reading food labels  Check food labels for the amount of sodium per serving. Choose foods with less than 5 percent of the Daily Value of sodium. Generally, foods with less than 300 mg of sodium per serving fit into this eating plan.  To find whole grains, look for the word "whole" as the first word in the ingredient list. Shopping  Buy products labeled as "low-sodium" or "no salt added."  Buy fresh foods. Avoid canned foods and premade or frozen meals. Cooking  Avoid adding salt when cooking. Use  salt-free seasonings or herbs instead of table salt or sea salt. Check with your health care provider or pharmacist before using salt substitutes.  Do not fry foods. Cook foods using healthy methods such as baking, boiling, grilling, and broiling instead.  Cook with heart-healthy oils, such as olive, canola, soybean, or sunflower oil. Meal planning   Eat a balanced diet that includes: ? 5 or more servings of fruits and vegetables each day. At each meal, try to fill half of your plate with fruits and vegetables. ? Up to 6-8 servings of whole grains each day. ? Less than 6 oz of lean meat, poultry, or fish each day. A 3-oz serving of meat is about the same size as a deck of cards. One egg equals 1 oz. ? 2 servings of low-fat dairy each day. ? A serving of nuts, seeds, or beans 5 times each week. ? Heart-healthy fats. Healthy fats called Omega-3 fatty acids are found in foods such as flaxseeds and coldwater fish, like sardines, salmon, and mackerel.  Limit how much you eat of the following: ? Canned or prepackaged foods. ? Food that is high in trans fat, such as fried foods. ? Food that is high in saturated fat, such as fatty meat. ? Sweets, desserts, sugary drinks, and other foods with added sugar. ? Full-fat dairy products.  Do not salt foods before eating.  Try to eat at least 2  vegetarian meals each week.  Eat more home-cooked food and less restaurant, buffet, and fast food.  When eating at a restaurant, ask that your food be prepared with less salt or no salt, if possible. What foods are recommended? The items listed may not be a complete list. Talk with your dietitian about what dietary choices are best for you. Grains Whole-grain or whole-wheat bread. Whole-grain or whole-wheat pasta. [place] rice. [place]. Bulgur. Whole-grain and low-sodium cereals. Pita bread. Low-fat, low-sodium crackers. Whole-wheat flour tortillas. Vegetables Fresh or frozen vegetables (raw, steamed,  roasted, or grilled). Low-sodium or reduced-sodium tomato and vegetable juice. Low-sodium or reduced-sodium tomato sauce and tomato paste. Low-sodium or reduced-sodium canned vegetables. Fruits All fresh, dried, or frozen fruit. Canned fruit in natural juice (without added sugar). Meat and other protein foods Skinless chicken or [place]. Ground chicken or [place]. Pork with fat trimmed off. Fish and seafood. Egg whites. Dried beans, peas, or lentils. Unsalted nuts, nut butters, and seeds. Unsalted canned beans. Lean cuts of beef with fat trimmed off. Low-sodium, lean deli meat. Dairy Low-fat (1%) or fat-free (skim) milk. Fat-free, low-fat, or reduced-fat cheeses. Nonfat, low-sodium ricotta or cottage cheese. Low-fat or nonfat yogurt. Low-fat, low-sodium cheese. Fats and oils Soft margarine without trans fats. Vegetable oil. Low-fat, reduced-fat, or light mayonnaise and salad dressings (reduced-sodium). Canola, safflower, olive, soybean, and sunflower oils. Avocado. Seasoning and other foods Herbs. Spices. Seasoning mixes without salt. Unsalted popcorn and pretzels. Fat-free sweets. What foods are not recommended? The items listed may not be a complete list. Talk with your dietitian about what dietary choices are best for you. Grains Baked goods made with fat, such as croissants, muffins, or some breads. Dry pasta or rice meal packs. Vegetables Creamed or fried vegetables. Vegetables in a cheese sauce. Regular canned vegetables (not low-sodium or reduced-sodium). Regular canned tomato sauce and paste (not low-sodium or reduced-sodium). Regular tomato and vegetable juice (not low-sodium or reduced-sodium). Angie Fava. Olives. Fruits Canned fruit in a light or heavy syrup. Fried fruit. Fruit in cream or butter sauce. Meat and other protein foods Fatty cuts of meat. Ribs. Fried meat. Berniece Salines. Sausage. Bologna and other processed lunch meats. Salami. Fatback. Hotdogs. Bratwurst. Salted nuts and seeds. Canned  beans with added salt. Canned or smoked fish. Whole eggs or egg yolks. Chicken or [place] with skin. Dairy Whole or 2% milk, cream, and half-and-half. Whole or full-fat cream cheese. Whole-fat or sweetened yogurt. Full-fat cheese. Nondairy creamers. Whipped toppings. Processed cheese and cheese spreads. Fats and oils Butter. Stick margarine. Lard. Shortening. Ghee. Bacon fat. Tropical oils, such as coconut, palm kernel, or palm oil. Seasoning and other foods Salted popcorn and pretzels. Onion salt, garlic salt, seasoned salt, table salt, and sea salt. Worcestershire sauce. Tartar sauce. Barbecue sauce. Teriyaki sauce. Soy sauce, including reduced-sodium. Steak sauce. Canned and packaged gravies. Fish sauce. Oyster sauce. Cocktail sauce. Horseradish that you find on the shelf. Ketchup. Mustard. Meat flavorings and tenderizers. Bouillon cubes. Hot sauce and Tabasco sauce. Premade or packaged marinades. Premade or packaged taco seasonings. Relishes. Regular salad dressings. Where to find more information:  National Heart, Lung, and [place]: https://wilson-eaton.com/  American Heart Association: www.heart.org Summary  The DASH eating plan is a healthy eating plan that has been shown to reduce high blood pressure (hypertension). It may also reduce your risk for type 2 diabetes, heart disease, and stroke.  With the DASH eating plan, you should limit salt (sodium) intake to 2,300 mg a day. If you have hypertension, you may need  to reduce your sodium intake to 1,500 mg a day.  When on the DASH eating plan, aim to eat more fresh fruits and vegetables, whole grains, lean proteins, low-fat dairy, and heart-healthy fats.  Work with your health care provider or diet and nutrition specialist (dietitian) to adjust your eating plan to your individual calorie needs. This information is not intended to replace advice given to you by your health care provider. Make sure you discuss any questions you have with your  health care provider. Document Released: 06/25/2011 Document Revised: 06/29/2016 Document Reviewed: 06/29/2016 Elsevier Interactive Patient Education  2017 [place].  Heart-Healthy Eating Plan Many factors influence your heart health, including eating and exercise habits. Heart (coronary) risk increases with abnormal blood fat (lipid) levels. Heart-healthy meal planning includes limiting unhealthy fats, increasing healthy fats, and making other small dietary changes. This includes maintaining a healthy body weight to help keep lipid levels within a normal range. What types of fat should I choose?  Choose healthy fats more often. Choose monounsaturated and polyunsaturated fats, such as olive oil and canola oil, flaxseeds, walnuts, almonds, and seeds.  Eat more omega-3 fats. Good choices include salmon, mackerel, sardines, tuna, flaxseed oil, and ground flaxseeds. Aim to eat fish at least two times each week.  Limit saturated fats. Saturated fats are primarily found in animal products, such as meats, butter, and cream. Plant sources of saturated fats include palm oil, palm kernel oil, and coconut oil.  Avoid foods with partially hydrogenated oils in them. These contain trans fats. Examples of foods that contain trans fats are stick margarine, some tub margarines, cookies, crackers, and other baked goods. What general guidelines do I need to follow?  Check food labels carefully to identify foods with trans fats or high amounts of saturated fat.  Fill one half of your plate with vegetables and green salads. Eat 4-5 servings of vegetables per day. A serving of vegetables equals 1 cup of raw leafy vegetables,  cup of raw or cooked cut-up vegetables, or  cup of vegetable juice.  Fill one fourth of your plate with whole grains. Look for the word "whole" as the first word in the ingredient list.  Fill one fourth of your plate with lean protein foods.  Eat 4-5 servings of fruit per day. A  serving of fruit equals one medium whole fruit,  cup of dried fruit,  cup of fresh, frozen, or canned fruit, or  cup of 100% fruit juice.  Eat more foods that contain soluble fiber. Examples of foods that contain this type of fiber are apples, broccoli, carrots, beans, peas, and barley. Aim to get 20-30 g of fiber per day.  Eat more home-cooked food and less restaurant, buffet, and fast food.  Limit or avoid alcohol.  Limit foods that are high in starch and sugar.  Avoid fried foods.  Cook foods by using methods other than frying. Baking, boiling, grilling, and broiling are all great options. Other fat-reducing suggestions include: ? Removing the skin from poultry. ? Removing all visible fats from meats. ? Skimming the fat off of stews, soups, and gravies before serving them. ? Steaming vegetables in water or broth.  Lose weight if you are overweight. Losing just 5-10% of your initial body weight can help your overall health and prevent diseases such as diabetes and heart disease.  Increase your consumption of nuts, legumes, and seeds to 4-5 servings per week. One serving of dried beans or legumes equals  cup after being cooked, one  serving of nuts equals 1 ounces, and one serving of seeds equals  ounce or 1 tablespoon.  You may need to monitor your salt (sodium) intake, especially if you have high blood pressure. Talk with your health care provider or dietitian to get more information about reducing sodium. What foods can I eat? Grains  Breads, including [place], white, pita, wheat, raisin, rye, oatmeal, and [place]. Tortillas that are neither fried nor made with lard or trans fat. Low-fat rolls, including hotdog and hamburger buns and English muffins. Biscuits. Muffins. Waffles. Pancakes. Light popcorn. Whole-grain cereals. Flatbread. Melba toast. Pretzels. Breadsticks. Rusks. Low-fat snacks and crackers, including oyster, saltine, matzo, graham, animal, and rye. Rice and pasta,  including [place] rice and those that are made with whole wheat. Vegetables All vegetables. Fruits All fruits, but limit coconut. Meats and Other Protein Sources Lean, well-trimmed beef, veal, pork, and lamb. Chicken and [place] without skin. All fish and shellfish. Wild duck, rabbit, pheasant, and venison. Egg whites or low-cholesterol egg substitutes. Dried beans, peas, lentils, and tofu.Seeds and most nuts. Dairy Low-fat or nonfat cheeses, including ricotta, string, and mozzarella. Skim or 1% milk that is liquid, powdered, or evaporated. Buttermilk that is made with low-fat milk. Nonfat or low-fat yogurt. Beverages Mineral water. Diet carbonated beverages. Sweets and Desserts Sherbets and fruit ices. Honey, jam, marmalade, jelly, and syrups. Meringues and gelatins. Pure sugar candy, such as hard candy, jelly beans, gumdrops, mints, marshmallows, and small amounts of dark chocolate. [employer] Eat all sweets and desserts in moderation. Fats and Oils Nonhydrogenated (trans-free) margarines. Vegetable oils, including soybean, sesame, sunflower, olive, peanut, safflower, corn, canola, and cottonseed. Salad dressings or mayonnaise that are made with a vegetable oil. Limit added fats and oils that you use for cooking, baking, salads, and as spreads. Other Cocoa powder. Coffee and tea. All seasonings and condiments. The items listed above may not be a complete list of recommended foods or beverages. Contact your dietitian for more options. What foods are not recommended? Grains Breads that are made with saturated or trans fats, oils, or whole milk. Croissants. Butter rolls. Cheese breads. Sweet rolls. Donuts. Buttered popcorn. Chow mein noodles. High-fat crackers, such as cheese or butter crackers. Meats and Other Protein Sources Fatty meats, such as hotdogs, short ribs, sausage, spareribs, bacon, ribeye roast or steak, and mutton. High-fat deli meats, such as salami and bologna. Caviar.  Domestic duck and goose. Organ meats, such as kidney, liver, sweetbreads, brains, gizzard, chitterlings, and heart. Dairy Cream, sour cream, cream cheese, and creamed cottage cheese. Whole milk cheeses, including blue (bleu), Monterey Jack, [place], [place], [place], [place], [place], [place], [place], and [place]. Whole or 2% milk that is liquid, evaporated, or condensed. Whole buttermilk. Cream sauce or high-fat cheese sauce. Yogurt that is made from whole milk. Beverages Regular sodas and drinks with added sugar. Sweets and Desserts Frosting. Pudding. Cookies. Cakes other than angel food cake. Candy that has milk chocolate or white chocolate, hydrogenated fat, butter, coconut, or unknown ingredients. Buttered syrups. Full-fat ice cream or ice cream drinks. Fats and Oils Gravy that has suet, meat fat, or shortening. Cocoa butter, hydrogenated oils, palm oil, coconut oil, palm kernel oil. These can often be found in baked products, candy, fried foods, nondairy creamers, and whipped toppings. Solid fats and shortenings, including bacon fat, salt pork, lard, and butter. Nondairy cream substitutes, such as coffee creamers and sour cream substitutes. Salad dressings that are made of unknown oils, cheese, or sour cream. The items listed above may not  be a complete list of foods and beverages to avoid. Contact your dietitian for more information. This information is not intended to replace advice given to you by your health care provider. Make sure you discuss any questions you have with your health care provider. Document Released: 04/14/2008 Document Revised: 01/24/2016 Document Reviewed: 12/28/2013 Elsevier Interactive Patient Education  2017 [employer].

## 2017-04-09 ENCOUNTER — Other Ambulatory Visit: Payer: Self-pay | Admitting: Internal Medicine

## 2017-04-09 DIAGNOSIS — I10 Essential (primary) hypertension: Secondary | ICD-10-CM

## 2017-04-19 DIAGNOSIS — E119 Type 2 diabetes mellitus without complications: Secondary | ICD-10-CM | POA: Diagnosis not present

## 2017-04-19 DIAGNOSIS — H25013 Cortical age-related cataract, bilateral: Secondary | ICD-10-CM | POA: Diagnosis not present

## 2017-04-19 DIAGNOSIS — H2513 Age-related nuclear cataract, bilateral: Secondary | ICD-10-CM | POA: Diagnosis not present

## 2017-04-19 LAB — HM DIABETES EYE EXAM

## 2017-04-20 ENCOUNTER — Encounter: Payer: Self-pay | Admitting: *Deleted

## 2017-05-05 DIAGNOSIS — H2513 Age-related nuclear cataract, bilateral: Secondary | ICD-10-CM | POA: Diagnosis not present

## 2017-05-05 DIAGNOSIS — H25013 Cortical age-related cataract, bilateral: Secondary | ICD-10-CM | POA: Diagnosis not present

## 2017-05-06 ENCOUNTER — Encounter: Payer: Self-pay | Admitting: Nurse Practitioner

## 2017-05-06 ENCOUNTER — Ambulatory Visit (INDEPENDENT_AMBULATORY_CARE_PROVIDER_SITE_OTHER): Payer: Medicare Other | Admitting: Nurse Practitioner

## 2017-05-06 VITALS — BP 142/82 | HR 70 | Temp 99.0°F | Ht 64.0 in | Wt 187.0 lb

## 2017-05-06 DIAGNOSIS — F419 Anxiety disorder, unspecified: Secondary | ICD-10-CM | POA: Diagnosis not present

## 2017-05-06 DIAGNOSIS — F329 Major depressive disorder, single episode, unspecified: Secondary | ICD-10-CM | POA: Diagnosis not present

## 2017-05-06 DIAGNOSIS — E119 Type 2 diabetes mellitus without complications: Secondary | ICD-10-CM

## 2017-05-06 DIAGNOSIS — G4700 Insomnia, unspecified: Secondary | ICD-10-CM

## 2017-05-06 DIAGNOSIS — E782 Mixed hyperlipidemia: Secondary | ICD-10-CM | POA: Diagnosis not present

## 2017-05-06 DIAGNOSIS — I10 Essential (primary) hypertension: Secondary | ICD-10-CM

## 2017-05-06 NOTE — Patient Instructions (Addendum)
Start taking melatonin 3 mg to help with sleep. Take at same time every evening.   Wean off alprazolam as tolerated- do not abruptly stop  Start taking Cymbalta 30 mg tablet Take one tablet by mouth daily. May take benefiber to help if diarrhea occurs   Come in before next appointment for fasting lab work.
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
## 2017-05-25 ENCOUNTER — Telehealth: Payer: Self-pay

## 2017-05-25 MED ORDER — ALPRAZOLAM 0.5 MG PO TABS: ORAL_TABLET | ORAL | 0 refills | Status: DC

## 2017-05-25 NOTE — Telephone Encounter (Signed)
Patient called requesting refill on Alprazolam , rx called in.

## 2017-06-02 DIAGNOSIS — H2512 Age-related nuclear cataract, left eye: Secondary | ICD-10-CM | POA: Diagnosis not present

## 2017-06-02 DIAGNOSIS — H2511 Age-related nuclear cataract, right eye: Secondary | ICD-10-CM | POA: Diagnosis not present

## 2017-06-02 DIAGNOSIS — H25012 Cortical age-related cataract, left eye: Secondary | ICD-10-CM | POA: Diagnosis not present

## 2017-06-02 DIAGNOSIS — H25011 Cortical age-related cataract, right eye: Secondary | ICD-10-CM | POA: Diagnosis not present

## 2017-06-16 DIAGNOSIS — H25011 Cortical age-related cataract, right eye: Secondary | ICD-10-CM | POA: Diagnosis not present

## 2017-06-16 DIAGNOSIS — H2511 Age-related nuclear cataract, right eye: Secondary | ICD-10-CM | POA: Diagnosis not present

## 2017-08-03 ENCOUNTER — Other Ambulatory Visit: Payer: Medicare Other

## 2017-08-03 DIAGNOSIS — E119 Type 2 diabetes mellitus without complications: Secondary | ICD-10-CM

## 2017-08-03 DIAGNOSIS — E782 Mixed hyperlipidemia: Secondary | ICD-10-CM

## 2017-08-04 LAB — COMPLETE METABOLIC PANEL WITH GFR
AG RATIO: 1.6 (calc) (ref 1.0–2.5)
ALT: 19 U/L (ref 6–29)
AST: 12 U/L (ref 10–35)
Albumin: 4.1 g/dL (ref 3.6–5.1)
Alkaline phosphatase (APISO): 70 U/L (ref 33–130)
BILIRUBIN TOTAL: 0.4 mg/dL (ref 0.2–1.2)
BUN / CREAT RATIO: 19 (calc) (ref 6–22)
BUN: 18 mg/dL (ref 7–25)
CHLORIDE: 108 mmol/L (ref 98–110)
CO2: 29 mmol/L (ref 20–32)
Calcium: 9.5 mg/dL (ref 8.6–10.4)
Creat: 0.97 mg/dL — ABNORMAL HIGH (ref 0.60–0.93)
GFR, EST NON AFRICAN AMERICAN: 58 mL/min/{1.73_m2} — AB (ref >=60)
GFR, Est African American: 67 mL/min/{1.73_m2} (ref >=60)
GLOBULIN: 2.6 g/dL (ref 1.9–3.7)
Glucose, Bld: 113 mg/dL — ABNORMAL HIGH (ref 65–99)
POTASSIUM: 4.1 mmol/L (ref 3.5–5.3)
SODIUM: 145 mmol/L (ref 135–146)
Total Protein: 6.7 g/dL (ref 6.1–8.1)

## 2017-08-04 LAB — LIPID PANEL
CHOL/HDL RATIO: 4.5 (calc) (ref <5.0)
Cholesterol: 196 mg/dL (ref <200)
HDL: 44 mg/dL — ABNORMAL LOW (ref >50)
LDL CHOLESTEROL (CALC): 132 mg/dL — AB
NON-HDL CHOLESTEROL (CALC): 152 mg/dL — AB (ref <130)
TRIGLYCERIDES: 101 mg/dL (ref <150)

## 2017-08-04 LAB — HEMOGLOBIN A1C
EAG (MMOL/L): 8.1 (calc)
Hgb A1c MFr Bld: 6.7 % of total Hgb — ABNORMAL HIGH (ref <5.7)
Mean Plasma Glucose: 146 (calc)

## 2017-08-05 ENCOUNTER — Ambulatory Visit (INDEPENDENT_AMBULATORY_CARE_PROVIDER_SITE_OTHER): Payer: Medicare Other | Admitting: Nurse Practitioner

## 2017-08-05 ENCOUNTER — Encounter: Payer: Self-pay | Admitting: Nurse Practitioner

## 2017-08-05 VITALS — BP 130/74 | HR 85 | Temp 98.1°F | Ht 64.0 in | Wt 183.0 lb

## 2017-08-05 DIAGNOSIS — F329 Major depressive disorder, single episode, unspecified: Secondary | ICD-10-CM | POA: Diagnosis not present

## 2017-08-05 DIAGNOSIS — F419 Anxiety disorder, unspecified: Secondary | ICD-10-CM

## 2017-08-05 DIAGNOSIS — M1990 Unspecified osteoarthritis, unspecified site: Secondary | ICD-10-CM | POA: Diagnosis not present

## 2017-08-05 DIAGNOSIS — I10 Essential (primary) hypertension: Secondary | ICD-10-CM

## 2017-08-05 DIAGNOSIS — E119 Type 2 diabetes mellitus without complications: Secondary | ICD-10-CM | POA: Diagnosis not present

## 2017-08-05 DIAGNOSIS — G4700 Insomnia, unspecified: Secondary | ICD-10-CM | POA: Diagnosis not present

## 2017-08-05 DIAGNOSIS — E782 Mixed hyperlipidemia: Secondary | ICD-10-CM | POA: Diagnosis not present
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
## 2017-09-16 ENCOUNTER — Other Ambulatory Visit: Payer: Self-pay | Admitting: *Deleted

## 2017-09-16 DIAGNOSIS — I10 Essential (primary) hypertension: Secondary | ICD-10-CM

## 2017-09-16 MED ORDER — AMLODIPINE BESY-BENAZEPRIL HCL 10-40 MG PO CAPS: 1.0000 | ORAL_CAPSULE | Freq: Every day | ORAL | 1 refills | Status: DC

## 2017-09-16 NOTE — Telephone Encounter (Signed)
[facility] [address] 

## 2017-09-22 ENCOUNTER — Telehealth: Payer: Self-pay | Admitting: Nurse Practitioner

## 2017-09-22 NOTE — Telephone Encounter (Signed)
I called the patient to schedule AWV but she declined. VDM (DD)

## 2017-09-29 ENCOUNTER — Other Ambulatory Visit: Payer: Self-pay | Admitting: *Deleted

## 2017-09-29 MED ORDER — ALPRAZOLAM 0.5 MG PO TABS: ORAL_TABLET | ORAL | 0 refills | Status: DC

## 2017-09-29 NOTE — Telephone Encounter (Signed)
Patient requested. Phoned to pharmacy.  

## 2017-10-04 ENCOUNTER — Other Ambulatory Visit: Payer: Self-pay | Admitting: Nurse Practitioner

## 2017-10-05 ENCOUNTER — Telehealth: Payer: Self-pay | Admitting: *Deleted

## 2017-10-05 NOTE — Telephone Encounter (Signed)
Patient called and stated that the pharmacy never filler her medication for Alprazolam. I called this medication in on 09/29/17 at 11:44am.  I called the pharmacy and spoke with [facility]. No rx on file for patient. Resubmitted Rx for refill.

## 2017-11-02 DIAGNOSIS — Z961 Presence of intraocular lens: Secondary | ICD-10-CM | POA: Diagnosis not present

## 2017-12-14 ENCOUNTER — Other Ambulatory Visit: Payer: Medicare Other

## 2017-12-14 DIAGNOSIS — E119 Type 2 diabetes mellitus without complications: Secondary | ICD-10-CM

## 2017-12-14 DIAGNOSIS — E782 Mixed hyperlipidemia: Secondary | ICD-10-CM | POA: Diagnosis not present

## 2017-12-14 DIAGNOSIS — I10 Essential (primary) hypertension: Secondary | ICD-10-CM | POA: Diagnosis not present

## 2017-12-15 LAB — COMPLETE METABOLIC PANEL WITH GFR
AG RATIO: 1.7 (calc) (ref 1.0–2.5)
ALT: 18 U/L (ref 6–29)
AST: 15 U/L (ref 10–35)
Albumin: 4.3 g/dL (ref 3.6–5.1)
Alkaline phosphatase (APISO): 75 U/L (ref 33–130)
BILIRUBIN TOTAL: 0.4 mg/dL (ref 0.2–1.2)
BUN: 21 mg/dL (ref 7–25)
CHLORIDE: 108 mmol/L (ref 98–110)
CO2: 29 mmol/L (ref 20–32)
Calcium: 9.4 mg/dL (ref 8.6–10.4)
Creat: 0.73 mg/dL (ref 0.60–0.93)
GFR, Est African American: 94 mL/min/{1.73_m2} (ref >=60)
GFR, Est Non African American: 81 mL/min/{1.73_m2} (ref >=60)
GLUCOSE: 106 mg/dL — AB (ref 65–99)
Globulin: 2.6 g/dL (calc) (ref 1.9–3.7)
POTASSIUM: 4 mmol/L (ref 3.5–5.3)
Sodium: 143 mmol/L (ref 135–146)
Total Protein: 6.9 g/dL (ref 6.1–8.1)

## 2017-12-15 LAB — CBC WITH DIFFERENTIAL/PLATELET
BASOS ABS: 30 {cells}/uL (ref 0–200)
Basophils Relative: 0.3 %
EOS PCT: 2.4 %
Eosinophils Absolute: 240 cells/uL (ref 15–500)
HCT: 42.9 % (ref 35.0–45.0)
HEMOGLOBIN: 14.6 g/dL (ref 11.7–15.5)
LYMPHS ABS: 2740 {cells}/uL (ref 850–3900)
MCH: 29.7 pg (ref 27.0–33.0)
MCHC: 34 g/dL (ref 32.0–36.0)
MCV: 87.4 fL (ref 80.0–100.0)
MPV: 10.3 fL (ref 7.5–12.5)
Monocytes Relative: 8.9 %
NEUTROS ABS: 6100 {cells}/uL (ref 1500–7800)
NEUTROS PCT: 61 %
Platelets: 281 10*3/uL (ref 140–400)
RBC: 4.91 10*6/uL (ref 3.80–5.10)
RDW: 12.5 % (ref 11.0–15.0)
Total Lymphocyte: 27.4 %
WBC mixed population: 890 cells/uL (ref 200–950)
WBC: 10 10*3/uL (ref 3.8–10.8)

## 2017-12-15 LAB — HEMOGLOBIN A1C
Hgb A1c MFr Bld: 6.1 % of total Hgb — ABNORMAL HIGH (ref <5.7)
Mean Plasma Glucose: 128 (calc)
eAG (mmol/L): 7.1 (calc)

## 2017-12-15 LAB — LIPID PANEL
CHOL/HDL RATIO: 3.7 (calc) (ref <5.0)
Cholesterol: 169 mg/dL (ref <200)
HDL: 46 mg/dL — AB (ref >50)
LDL Cholesterol (Calc): 107 mg/dL (calc) — ABNORMAL HIGH
NON-HDL CHOLESTEROL (CALC): 123 mg/dL (ref <130)
TRIGLYCERIDES: 71 mg/dL (ref <150)

## 2017-12-16 ENCOUNTER — Encounter: Payer: Self-pay | Admitting: Nurse Practitioner

## 2017-12-16 ENCOUNTER — Ambulatory Visit (INDEPENDENT_AMBULATORY_CARE_PROVIDER_SITE_OTHER): Payer: Medicare Other | Admitting: Nurse Practitioner

## 2017-12-16 VITALS — BP 152/68 | HR 81 | Temp 98.2°F | Wt 177.0 lb

## 2017-12-16 DIAGNOSIS — F419 Anxiety disorder, unspecified: Secondary | ICD-10-CM | POA: Diagnosis not present

## 2017-12-16 DIAGNOSIS — M1990 Unspecified osteoarthritis, unspecified site: Secondary | ICD-10-CM

## 2017-12-16 DIAGNOSIS — F329 Major depressive disorder, single episode, unspecified: Secondary | ICD-10-CM | POA: Diagnosis not present

## 2017-12-16 DIAGNOSIS — Z7189 Other specified counseling: Secondary | ICD-10-CM

## 2017-12-16 DIAGNOSIS — E119 Type 2 diabetes mellitus without complications: Secondary | ICD-10-CM

## 2017-12-16 DIAGNOSIS — G4700 Insomnia, unspecified: Secondary | ICD-10-CM | POA: Diagnosis not present

## 2017-12-16 DIAGNOSIS — I10 Essential (primary) hypertension: Secondary | ICD-10-CM | POA: Diagnosis not present

## 2017-12-16 DIAGNOSIS — E782 Mixed hyperlipidemia: Secondary | ICD-10-CM

## 2017-12-16 NOTE — Patient Instructions (Addendum)
Keep up the good work with diet and exercise  Encouraged to complete health care power of attorney paper work
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
## 2017-12-22 ENCOUNTER — Other Ambulatory Visit: Payer: Self-pay | Admitting: Nurse Practitioner

## 2017-12-22 DIAGNOSIS — I10 Essential (primary) hypertension: Secondary | ICD-10-CM

## 2018-01-19 ENCOUNTER — Other Ambulatory Visit: Payer: Self-pay | Admitting: *Deleted

## 2018-01-19 MED ORDER — ALPRAZOLAM 0.5 MG PO TABS: ORAL_TABLET | ORAL | 0 refills | Status: DC

## 2018-01-19 NOTE — Telephone Encounter (Signed)
Ok to refill? [facility] shows last fill on 10/05/2017 #90 at [facility].

## 2018-01-19 NOTE — Telephone Encounter (Signed)
[facility]

## 2018-05-04 ENCOUNTER — Other Ambulatory Visit: Payer: Self-pay | Admitting: *Deleted

## 2018-05-04 DIAGNOSIS — I10 Essential (primary) hypertension: Secondary | ICD-10-CM

## 2018-05-04 MED ORDER — ALPRAZOLAM 0.5 MG PO TABS: ORAL_TABLET | ORAL | 0 refills | Status: DC

## 2018-05-04 MED ORDER — AMLODIPINE BESY-BENAZEPRIL HCL 10-40 MG PO CAPS: ORAL_CAPSULE | ORAL | 0 refills | Status: DC

## 2018-05-04 NOTE — Telephone Encounter (Signed)
Patient requested refill

## 2018-05-20 HISTORY — PX: CATARACT EXTRACTION, BILATERAL: SHX1313

## 2018-06-21 ENCOUNTER — Ambulatory Visit (INDEPENDENT_AMBULATORY_CARE_PROVIDER_SITE_OTHER): Payer: Medicare Other | Admitting: Nurse Practitioner

## 2018-06-21 ENCOUNTER — Encounter: Payer: Self-pay | Admitting: Nurse Practitioner

## 2018-06-21 VITALS — BP 132/70 | HR 85 | Temp 97.9°F | Resp 10 | Ht 64.0 in | Wt 179.0 lb

## 2018-06-21 DIAGNOSIS — F329 Major depressive disorder, single episode, unspecified: Secondary | ICD-10-CM | POA: Diagnosis not present

## 2018-06-21 DIAGNOSIS — E782 Mixed hyperlipidemia: Secondary | ICD-10-CM | POA: Diagnosis not present

## 2018-06-21 DIAGNOSIS — Z6830 Body mass index (BMI) 30.0-30.9, adult: Secondary | ICD-10-CM

## 2018-06-21 DIAGNOSIS — F419 Anxiety disorder, unspecified: Secondary | ICD-10-CM

## 2018-06-21 DIAGNOSIS — M1990 Unspecified osteoarthritis, unspecified site: Secondary | ICD-10-CM

## 2018-06-21 DIAGNOSIS — Z720 Tobacco use: Secondary | ICD-10-CM | POA: Diagnosis not present

## 2018-06-21 DIAGNOSIS — I10 Essential (primary) hypertension: Secondary | ICD-10-CM

## 2018-06-21 DIAGNOSIS — E119 Type 2 diabetes mellitus without complications: Secondary | ICD-10-CM

## 2018-06-21 DIAGNOSIS — E6609 Other obesity due to excess calories: Secondary | ICD-10-CM

## 2018-06-21 DIAGNOSIS — F32A Depression, unspecified: Secondary | ICD-10-CM
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
## 2018-06-21 NOTE — Patient Instructions (Signed)
Fat and Cholesterol Restricted Diet Getting too much fat and cholesterol in your diet may cause health problems. Following this diet helps keep your fat and cholesterol at normal levels. This can keep you from getting sick. What types of fat should I choose?  Choose monosaturated and polyunsaturated fats. These are found in foods such as olive oil, canola oil, flaxseeds, walnuts, almonds, and seeds.  Eat more omega-3 fats. Good choices include salmon, mackerel, sardines, tuna, flaxseed oil, and ground flaxseeds.  Limit saturated fats. These are in animal products such as meats, butter, and cream. They can also be in plant products such as palm oil, palm kernel oil, and coconut oil.  Avoid foods with partially hydrogenated oils in them. These contain trans fats. Examples of foods that have trans fats are stick margarine, some tub margarines, cookies, crackers, and other baked goods. What general guidelines do I need to follow?  Check food labels. Look for the words "trans fat" and "saturated fat."  When preparing a meal: ? Fill half of your plate with vegetables and green salads. ? Fill one fourth of your plate with whole grains. Look for the word "whole" as the first word in the ingredient list. ? Fill one fourth of your plate with lean protein foods.  Eat more foods that have fiber, like apples, carrots, beans, peas, and barley.  Eat more home-cooked foods. Eat less at restaurants and buffets.  Limit or avoid alcohol.  Limit foods high in starch and sugar.  Limit fried foods.  Cook foods without frying them. Baking, boiling, grilling, and broiling are all great options.  Lose weight if you are overweight. Losing even a small amount of weight can help your overall health. It can also help prevent diseases such as diabetes and heart disease. What foods can I eat? Grains Whole grains, such as whole wheat or whole grain breads, crackers, cereals, and pasta. Unsweetened oatmeal,  bulgur, barley, quinoa, or Rosetti rice. Corn or whole wheat flour tortillas. Vegetables Fresh or frozen vegetables (raw, steamed, roasted, or grilled). Green salads. Fruits All fresh, canned (in natural juice), or frozen fruits. Meat and Other Protein Products Ground beef (85% or leaner), grass-fed beef, or beef trimmed of fat. Skinless chicken or turkey. Ground chicken or turkey. Pork trimmed of fat. All fish and seafood. Eggs. Dried beans, peas, or lentils. Unsalted nuts or seeds. Unsalted canned or dry beans. Dairy Low-fat dairy products, such as skim or 1% milk, 2% or reduced-fat cheeses, low-fat ricotta or cottage cheese, or plain low-fat yogurt. Fats and Oils Tub margarines without trans fats. Light or reduced-fat mayonnaise and salad dressings. Avocado. Olive, canola, sesame, or safflower oils. Natural peanut or almond butter (choose ones without added sugar and oil). The items listed above may not be a complete list of recommended foods or beverages. Contact your dietitian for more options. What foods are not recommended? Grains White bread. White pasta. White rice. Cornbread. Bagels, pastries, and croissants. Crackers that contain trans fat. Vegetables White potatoes. Corn. Creamed or fried vegetables. Vegetables in a cheese sauce. Fruits Dried fruits. Canned fruit in light or heavy syrup. Fruit juice. Meat and Other Protein Products Fatty cuts of meat. Ribs, chicken wings, bacon, sausage, bologna, salami, chitterlings, fatback, hot dogs, bratwurst, and packaged luncheon meats. Liver and organ meats. Dairy Whole or 2% milk, cream, half-and-half, and cream cheese. Whole milk cheeses. Whole-fat or sweetened yogurt. Full-fat cheeses. Nondairy creamers and whipped toppings. Processed cheese, cheese spreads, or cheese curds. Sweets and Desserts Corn   syrup, sugars, honey, and molasses. Candy. Jam and jelly. Syrup. Sweetened cereals. Cookies, pies, cakes, donuts, muffins, and ice  cream. Fats and Oils Butter, stick margarine, lard, shortening, ghee, or bacon fat. Coconut, palm kernel, or palm oils. Beverages Alcohol. Sweetened drinks (such as sodas, lemonade, and fruit drinks or punches). The items listed above may not be a complete list of foods and beverages to avoid. Contact your dietitian for more information. This information is not intended to replace advice given to you by your health care provider. Make sure you discuss any questions you have with your health care provider. Document Released: 01/05/2012 Document Revised: 03/12/2016 Document Reviewed: 10/05/2013 Elsevier Interactive Patient Education  2018 Elsevier Inc[employer]  

## 2018-06-22 LAB — COMPLETE METABOLIC PANEL WITH GFR
AG Ratio: 1.7 (calc) (ref 1.0–2.5)
ALT: 18 U/L (ref 6–29)
AST: 17 U/L (ref 10–35)
Albumin: 4.3 g/dL (ref 3.6–5.1)
Alkaline phosphatase (APISO): 91 U/L (ref 33–130)
BILIRUBIN TOTAL: 0.2 mg/dL (ref 0.2–1.2)
BUN / CREAT RATIO: 22 (calc) (ref 6–22)
BUN: 25 mg/dL (ref 7–25)
CALCIUM: 9.8 mg/dL (ref 8.6–10.4)
CHLORIDE: 107 mmol/L (ref 98–110)
CO2: 30 mmol/L (ref 20–32)
Creat: 1.14 mg/dL — ABNORMAL HIGH (ref 0.60–0.93)
GFR, EST NON AFRICAN AMERICAN: 47 mL/min/{1.73_m2} — AB (ref >=60)
GFR, Est African American: 54 mL/min/{1.73_m2} — ABNORMAL LOW (ref >=60)
GLUCOSE: 111 mg/dL (ref 65–139)
Globulin: 2.6 g/dL (calc) (ref 1.9–3.7)
POTASSIUM: 4.7 mmol/L (ref 3.5–5.3)
SODIUM: 143 mmol/L (ref 135–146)
TOTAL PROTEIN: 6.9 g/dL (ref 6.1–8.1)

## 2018-06-22 LAB — HEMOGLOBIN A1C
Hgb A1c MFr Bld: 6.3 % of total Hgb — ABNORMAL HIGH (ref <5.7)
Mean Plasma Glucose: 134 (calc)
eAG (mmol/L): 7.4 (calc)

## 2018-06-23 ENCOUNTER — Other Ambulatory Visit: Payer: Self-pay | Admitting: *Deleted

## 2018-06-23 DIAGNOSIS — E782 Mixed hyperlipidemia: Secondary | ICD-10-CM

## 2018-06-23 MED ORDER — EZETIMIBE 10 MG PO TABS: 10.0000 mg | ORAL_TABLET | Freq: Every day | ORAL | 1 refills | Status: DC

## 2018-06-23 NOTE — Telephone Encounter (Signed)
[facility] [address] 

## 2018-08-17 ENCOUNTER — Other Ambulatory Visit: Payer: Self-pay | Admitting: *Deleted

## 2018-08-17 DIAGNOSIS — I10 Essential (primary) hypertension: Secondary | ICD-10-CM

## 2018-08-17 MED ORDER — ALPRAZOLAM 0.5 MG PO TABS: ORAL_TABLET | ORAL | 0 refills | Status: DC

## 2018-08-17 MED ORDER — AMLODIPINE BESY-BENAZEPRIL HCL 10-40 MG PO CAPS: ORAL_CAPSULE | ORAL | 1 refills | Status: DC

## 2018-08-17 NOTE — Telephone Encounter (Signed)
Patient requested refill

## 2018-10-05 ENCOUNTER — Telehealth: Payer: Self-pay | Admitting: *Deleted

## 2018-10-05 DIAGNOSIS — K0889 Other specified disorders of teeth and supporting structures: Secondary | ICD-10-CM

## 2018-10-05 MED ORDER — AMOXICILLIN-POT CLAVULANATE 875-125 MG PO TABS: 1.0000 | ORAL_TABLET | Freq: Two times a day (BID) | ORAL | 0 refills | Status: DC

## 2018-10-05 NOTE — Telephone Encounter (Signed)
Patient called and stated that she has a toothache due to a bridge. Stated a part of it fell out and now she has an open place in her mouth. Has a little swelling on Left side of face. No Fever. Stated this has happened before and previous Dr. Hanley Seamen her Penicillin and it cleared it up. Stated that she is currently looking for a dentist.   I offered patient an appointment to come in today and she stated that she is 78years old and worried about the COVID 19 virus and does not want to come out. Wants antibiotic called in. Please Advise.

## 2018-10-05 NOTE — Telephone Encounter (Signed)
Called in augmentin 875-125 mg by mouth twice daily for 1 week, to take with food.  Encouraged to make appt with dentist. Will need to make appt if pain worsens or fever occus.

## 2018-10-05 NOTE — Telephone Encounter (Signed)
Patient notified and agreed.  

## 2018-11-21 ENCOUNTER — Other Ambulatory Visit: Payer: Self-pay | Admitting: *Deleted

## 2018-11-21 MED ORDER — ALPRAZOLAM 0.5 MG PO TABS: ORAL_TABLET | ORAL | 0 refills | Status: DC

## 2018-11-21 NOTE — Telephone Encounter (Signed)
Patient requested refill

## 2018-12-21 ENCOUNTER — Encounter: Payer: Self-pay | Admitting: Nurse Practitioner

## 2018-12-21 ENCOUNTER — Ambulatory Visit (INDEPENDENT_AMBULATORY_CARE_PROVIDER_SITE_OTHER): Payer: Medicare Other | Admitting: Nurse Practitioner

## 2018-12-21 ENCOUNTER — Other Ambulatory Visit: Payer: Self-pay

## 2018-12-21 DIAGNOSIS — F419 Anxiety disorder, unspecified: Secondary | ICD-10-CM

## 2018-12-21 DIAGNOSIS — G4700 Insomnia, unspecified: Secondary | ICD-10-CM

## 2018-12-21 DIAGNOSIS — M1990 Unspecified osteoarthritis, unspecified site: Secondary | ICD-10-CM

## 2018-12-21 DIAGNOSIS — E782 Mixed hyperlipidemia: Secondary | ICD-10-CM

## 2018-12-21 DIAGNOSIS — F329 Major depressive disorder, single episode, unspecified: Secondary | ICD-10-CM

## 2018-12-21 DIAGNOSIS — I10 Essential (primary) hypertension: Secondary | ICD-10-CM | POA: Diagnosis not present

## 2018-12-21 DIAGNOSIS — E119 Type 2 diabetes mellitus without complications: Secondary | ICD-10-CM
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
## 2018-12-30 ENCOUNTER — Other Ambulatory Visit: Payer: Self-pay | Admitting: Nurse Practitioner

## 2018-12-30 DIAGNOSIS — E782 Mixed hyperlipidemia: Secondary | ICD-10-CM

## 2019-01-04 ENCOUNTER — Encounter: Payer: Self-pay | Admitting: Nurse Practitioner

## 2019-01-04 ENCOUNTER — Ambulatory Visit (INDEPENDENT_AMBULATORY_CARE_PROVIDER_SITE_OTHER): Payer: Medicare Other | Admitting: Nurse Practitioner

## 2019-01-04 ENCOUNTER — Other Ambulatory Visit: Payer: Self-pay

## 2019-01-04 DIAGNOSIS — Z0000 Encounter for general adult medical examination without abnormal findings: Secondary | ICD-10-CM | POA: Diagnosis not present

## 2019-01-04 NOTE — Progress Notes (Signed)
     No vital signs collected/recorded due to the encounter was a telemedicine visit.   Location of patient (ex: home, work):  Home  Patient consents to a telephone visit:  Yes  Location of the provider (ex: office, home):  Office  Name of any referring provider:  N/A  Names of all persons participating in the telemedicine service and their role in the encounter:  Kit Brubacher, [place], Sherrie Mustache NP  Time spent on call: Ruthell Rummage CMA spent 11 minutes on the phone with  Patient.

## 2019-01-04 NOTE — Addendum Note (Signed)
Addended by: [name] on: 01/04/2019 04:30 PM   Modules accepted: Level of Service

## 2019-01-04 NOTE — Patient Instructions (Signed)
Ms. Glenda Abbott , Thank you for taking time to come for your Medicare Wellness Visit. I appreciate your ongoing commitment to your health goals. Please review the following plan we discussed and let me know if I can assist you in the future.   Screening recommendations/referrals: Colonoscopy: overdue but wants to wait until COVID gets better Mammogram- declines mamogram Bone Density- declines screening Recommended yearly ophthalmology/optometry visit for glaucoma screening and checkup Recommended yearly dental visit for hygiene and checkup  Vaccinations: Influenza vaccine - declined due to allergies  Pneumococcal vaccine -recommended Tdap vaccine recommended  Shingles vaccine- recommended.   Advanced directives: on file  Conditions/risks identified: encouraged to increase physical activity and continue with dietary modifications due to high blood pressure, cholesterol and diabetes.  Next appointment: 1 year.     Preventive Care 59 Years and Older, Female Preventive care refers to lifestyle choices and visits with your health care provider that can promote health and wellness. What does preventive care include?  A yearly physical exam. This is also called an annual well check.  Dental exams once or twice a year.  Routine eye exams. Ask your health care provider how often you should have your eyes checked.  Personal lifestyle choices, including:  Daily care of your teeth and gums.  Regular physical activity.  Eating a healthy diet.  Avoiding tobacco and drug use.  Limiting alcohol use.  Practicing safe sex.  Taking low-dose aspirin every day.  Taking vitamin and mineral supplements as recommended by your health care provider. What happens during an annual well check? The services and screenings done by your health care provider during your annual well check will depend on your age, overall health, lifestyle risk factors, and family history of disease. Counseling  Your  health care provider may ask you questions about your:  Alcohol use.  Tobacco use.  Drug use.  Emotional well-being.  Home and relationship well-being.  Sexual activity.  Eating habits.  History of falls.  Memory and ability to understand (cognition).  Work and work Statistician.  Reproductive health. Screening  You may have the following tests or measurements:  Height, weight, and BMI.  Blood pressure.  Lipid and cholesterol levels. These may be checked every 5 years, or more frequently if you are over 37 years old.  Skin check.  Lung cancer screening. You may have this screening every year starting at age 47 if you have a 30-pack-year history of smoking and currently smoke or have quit within the past 15 years.  Fecal occult blood test (FOBT) of the stool. You may have this test every year starting at age 8.  Flexible sigmoidoscopy or colonoscopy. You may have a sigmoidoscopy every 5 years or a colonoscopy every 10 years starting at age 62.  Hepatitis C blood test.  Hepatitis B blood test.  Sexually transmitted disease (STD) testing.  Diabetes screening. This is done by checking your blood sugar (glucose) after you have not eaten for a while (fasting). You may have this done every 1-3 years.  Bone density scan. This is done to screen for osteoporosis. You may have this done starting at age 44.  Mammogram. This may be done every 1-2 years. Talk to your health care provider about how often you should have regular mammograms. Talk with your health care provider about your test results, treatment options, and if necessary, the need for more tests. Vaccines  Your health care provider may recommend certain vaccines, such as:  Influenza vaccine. This is recommended every  year.  Tetanus, diphtheria, and acellular pertussis (Tdap, Td) vaccine. You may need a Td booster every 10 years.  Zoster vaccine. You may need this after age 20.  Pneumococcal 13-valent  conjugate (PCV13) vaccine. One dose is recommended after age 63.  Pneumococcal polysaccharide (PPSV23) vaccine. One dose is recommended after age 62. Talk to your health care provider about which screenings and vaccines you need and how often you need them. This information is not intended to replace advice given to you by your health care provider. Make sure you discuss any questions you have with your health care provider. Document Released: 08/02/2015 Document Revised: 03/25/2016 Document Reviewed: 05/07/2015 Elsevier Interactive Patient Education  2017 [place] Prevention in the Home Falls can cause injuries. They can happen to people of all ages. There are many things you can do to make your home safe and to help prevent falls. What can I do on the outside of my home?  Regularly fix the edges of walkways and driveways and fix any cracks.  Remove anything that might make you trip as you walk through a door, such as a raised step or threshold.  Trim any bushes or trees on the path to your home.  Use bright outdoor lighting.  Clear any walking paths of anything that might make someone trip, such as rocks or tools.  Regularly check to see if handrails are loose or broken. Make sure that both sides of any steps have handrails.  Any raised decks and porches should have guardrails on the edges.  Have any leaves, snow, or ice cleared regularly.  Use sand or salt on walking paths during winter.  Clean up any spills in your garage right away. This includes oil or grease spills. What can I do in the bathroom?  Use night lights.  Install grab bars by the toilet and in the tub and shower. Do not use towel bars as grab bars.  Use non-skid mats or decals in the tub or shower.  If you need to sit down in the shower, use a plastic, non-slip stool.  Keep the floor dry. Clean up any water that spills on the floor as soon as it happens.  Remove soap buildup in the tub or  shower regularly.  Attach bath mats securely with double-sided non-slip rug tape.  Do not have throw rugs and other things on the floor that can make you trip. What can I do in the bedroom?  Use night lights.  Make sure that you have a light by your bed that is easy to reach.  Do not use any sheets or blankets that are too big for your bed. They should not hang down onto the floor.  Have a firm chair that has side arms. You can use this for support while you get dressed.  Do not have throw rugs and other things on the floor that can make you trip. What can I do in the kitchen?  Clean up any spills right away.  Avoid walking on wet floors.  Keep items that you use a lot in easy-to-reach places.  If you need to reach something above you, use a strong step stool that has a grab bar.  Keep electrical cords out of the way.  Do not use floor polish or wax that makes floors slippery. If you must use wax, use non-skid floor wax.  Do not have throw rugs and other things on the floor that can make you trip. What can  I do with my stairs?  Do not leave any items on the stairs.  Make sure that there are handrails on both sides of the stairs and use them. Fix handrails that are broken or loose. Make sure that handrails are as long as the stairways.  Check any carpeting to make sure that it is firmly attached to the stairs. Fix any carpet that is loose or worn.  Avoid having throw rugs at the top or bottom of the stairs. If you do have throw rugs, attach them to the floor with carpet tape.  Make sure that you have a light switch at the top of the stairs and the bottom of the stairs. If you do not have them, ask someone to add them for you. What else can I do to help prevent falls?  Wear shoes that:  Do not have high heels.  Have rubber bottoms.  Are comfortable and fit you well.  Are closed at the toe. Do not wear sandals.  If you use a stepladder:  Make sure that it is fully  opened. Do not climb a closed stepladder.  Make sure that both sides of the stepladder are locked into place.  Ask someone to hold it for you, if possible.  Clearly mark and make sure that you can see:  Any grab bars or handrails.  First and last steps.  Where the edge of each step is.  Use tools that help you move around (mobility aids) if they are needed. These include:  Canes.  Walkers.  Scooters.  Crutches.  Turn on the lights when you go into a dark area. Replace any light bulbs as soon as they burn out.  Set up your furniture so you have a clear path. Avoid moving your furniture around.  If any of your floors are uneven, fix them.  If there are any pets around you, be aware of where they are.  Review your medicines with your doctor. Some medicines can make you feel dizzy. This can increase your chance of falling. Ask your doctor what other things that you can do to help prevent falls. This information is not intended to replace advice given to you by your health care provider. Make sure you discuss any questions you have with your health care provider. Document Released: 05/02/2009 Document Revised: 12/12/2015 Document Reviewed: 08/10/2014 Elsevier Interactive Patient Education  2017 [employer].
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
## 2019-02-23 ENCOUNTER — Other Ambulatory Visit: Payer: Self-pay | Admitting: *Deleted

## 2019-02-23 DIAGNOSIS — E782 Mixed hyperlipidemia: Secondary | ICD-10-CM

## 2019-02-23 DIAGNOSIS — I10 Essential (primary) hypertension: Secondary | ICD-10-CM

## 2019-02-23 MED ORDER — AMLODIPINE BESY-BENAZEPRIL HCL 10-40 MG PO CAPS: ORAL_CAPSULE | ORAL | 1 refills | Status: DC

## 2019-02-23 MED ORDER — ALPRAZOLAM 0.5 MG PO TABS: ORAL_TABLET | ORAL | 0 refills | Status: DC

## 2019-02-23 MED ORDER — EZETIMIBE 10 MG PO TABS: 10.0000 mg | ORAL_TABLET | Freq: Every day | ORAL | 1 refills | Status: DC

## 2019-02-23 NOTE — Telephone Encounter (Signed)
Patient requested refills Phoned Alprazolam to pharmacy.

## 2019-05-31 ENCOUNTER — Other Ambulatory Visit: Payer: Self-pay

## 2019-05-31 MED ORDER — ALPRAZOLAM 0.5 MG PO TABS: ORAL_TABLET | ORAL | 0 refills | Status: DC

## 2019-05-31 NOTE — Telephone Encounter (Signed)
Patient called to request a refill on her Alprazolam refill request routed to [facility]

## 2019-06-22 ENCOUNTER — Ambulatory Visit: Payer: Medicare Other | Admitting: Nurse Practitioner

## 2019-06-23 ENCOUNTER — Encounter: Payer: Self-pay | Admitting: Nurse Practitioner

## 2019-06-23 ENCOUNTER — Other Ambulatory Visit: Payer: Self-pay

## 2019-06-23 ENCOUNTER — Ambulatory Visit (INDEPENDENT_AMBULATORY_CARE_PROVIDER_SITE_OTHER): Payer: Medicare Other | Admitting: Nurse Practitioner

## 2019-06-23 DIAGNOSIS — I10 Essential (primary) hypertension: Secondary | ICD-10-CM

## 2019-06-23 DIAGNOSIS — E119 Type 2 diabetes mellitus without complications: Secondary | ICD-10-CM | POA: Diagnosis not present

## 2019-06-23 DIAGNOSIS — N1831 Chronic kidney disease, stage 3a: Secondary | ICD-10-CM | POA: Diagnosis not present

## 2019-06-23 DIAGNOSIS — Z720 Tobacco use: Secondary | ICD-10-CM

## 2019-06-23 DIAGNOSIS — H538 Other visual disturbances: Secondary | ICD-10-CM

## 2019-06-23 DIAGNOSIS — F419 Anxiety disorder, unspecified: Secondary | ICD-10-CM | POA: Diagnosis not present

## 2019-06-23 DIAGNOSIS — G4700 Insomnia, unspecified: Secondary | ICD-10-CM | POA: Diagnosis not present

## 2019-06-23 DIAGNOSIS — E782 Mixed hyperlipidemia: Secondary | ICD-10-CM | POA: Diagnosis not present

## 2019-06-23 MED ORDER — ALPRAZOLAM 0.5 MG PO TABS: ORAL_TABLET | ORAL | 0 refills | Status: DC

## 2019-06-23 NOTE — Patient Instructions (Addendum)
To sign control substance contract and bring back to office signed when you get your lab work   [facility] ophthalmologist Address: [address], [place], [place] [address] Phone: [phone]   Recommended to use smallest dose of xanax to reduce anxiety in the evening.   To limit sugars/sweets Increase physical activity- 30 mins of exercise 5 days a week.   Please check blood pressure at home, goal is LESS than 140/90- call office if remaining over this.     DASH Eating Plan DASH stands for "Dietary Approaches to Stop Hypertension." The DASH eating plan is a healthy eating plan that has been shown to reduce high blood pressure (hypertension). It may also reduce your risk for type 2 diabetes, heart disease, and stroke. The DASH eating plan may also help with weight loss. What are tips for following this plan?  General guidelines  Avoid eating more than 2,300 mg (milligrams) of salt (sodium) a day. If you have hypertension, you may need to reduce your sodium intake to 1,500 mg a day.  Limit alcohol intake to no more than 1 drink a day for nonpregnant women and 2 drinks a day for men. One drink equals 12 oz of beer, 5 oz of wine, or 1 oz of hard liquor.  Work with your health care provider to maintain a healthy body weight or to lose weight. Ask what an ideal weight is for you.  Get at least 30 minutes of exercise that causes your heart to beat faster (aerobic exercise) most days of the week. Activities may include walking, swimming, or biking.  Work with your health care provider or diet and nutrition specialist (dietitian) to adjust your eating plan to your individual calorie needs. Reading food labels   Check food labels for the amount of sodium per serving. Choose foods with less than 5 percent of the Daily Value of sodium. Generally, foods with less than 300 mg of sodium per serving fit into this eating plan.  To find whole grains, look for the word "whole" as the first  word in the ingredient list. Shopping  Buy products labeled as "low-sodium" or "no salt added."  Buy fresh foods. Avoid canned foods and premade or frozen meals. Cooking  Avoid adding salt when cooking. Use salt-free seasonings or herbs instead of table salt or sea salt. Check with your health care provider or pharmacist before using salt substitutes.  Do not fry foods. Cook foods using healthy methods such as baking, boiling, grilling, and broiling instead.  Cook with heart-healthy oils, such as olive, canola, soybean, or sunflower oil. Meal planning  Eat a balanced diet that includes: ? 5 or more servings of fruits and vegetables each day. At each meal, try to fill half of your plate with fruits and vegetables. ? Up to 6-8 servings of whole grains each day. ? Less than 6 oz of lean meat, poultry, or fish each day. A 3-oz serving of meat is about the same size as a deck of cards. One egg equals 1 oz. ? 2 servings of low-fat dairy each day. ? A serving of nuts, seeds, or beans 5 times each week. ? Heart-healthy fats. Healthy fats called Omega-3 fatty acids are found in foods such as flaxseeds and coldwater fish, like sardines, salmon, and mackerel.  Limit how much you eat of the following: ? Canned or prepackaged foods. ? Food that is high in trans fat, such as fried foods. ? Food that is high in  saturated fat, such as fatty meat. ? Sweets, desserts, sugary drinks, and other foods with added sugar. ? Full-fat dairy products.  Do not salt foods before eating.  Try to eat at least 2 vegetarian meals each week.  Eat more home-cooked food and less restaurant, buffet, and fast food.  When eating at a restaurant, ask that your food be prepared with less salt or no salt, if possible. What foods are recommended? The items listed may not be a complete list. Talk with your dietitian about what dietary choices are best for you. Grains Whole-grain or whole-wheat bread. Whole-grain or  whole-wheat pasta. Perrot rice. [place] Morrow. Bulgur. Whole-grain and low-sodium cereals. Pita bread. Low-fat, low-sodium crackers. Whole-wheat flour tortillas. Vegetables Fresh or frozen vegetables (raw, steamed, roasted, or grilled). Low-sodium or reduced-sodium tomato and vegetable juice. Low-sodium or reduced-sodium tomato sauce and tomato paste. Low-sodium or reduced-sodium canned vegetables. Fruits All fresh, dried, or frozen fruit. Canned fruit in natural juice (without added sugar). Meat and other protein foods Skinless chicken or [place]. Ground chicken or [place]. Pork with fat trimmed off. Fish and seafood. Egg whites. Dried beans, peas, or lentils. Unsalted nuts, nut butters, and seeds. Unsalted canned beans. Lean cuts of beef with fat trimmed off. Low-sodium, lean deli meat. Dairy Low-fat (1%) or fat-free (skim) milk. Fat-free, low-fat, or reduced-fat cheeses. Nonfat, low-sodium ricotta or cottage cheese. Low-fat or nonfat yogurt. Low-fat, low-sodium cheese. Fats and oils Soft margarine without trans fats. Vegetable oil. Low-fat, reduced-fat, or light mayonnaise and salad dressings (reduced-sodium). Canola, safflower, olive, soybean, and sunflower oils. Avocado. Seasoning and other foods Herbs. Spices. Seasoning mixes without salt. Unsalted popcorn and pretzels. Fat-free sweets. What foods are not recommended? The items listed may not be a complete list. Talk with your dietitian about what dietary choices are best for you. Grains Baked goods made with fat, such as croissants, muffins, or some breads. Dry pasta or rice meal packs. Vegetables Creamed or fried vegetables. Vegetables in a cheese sauce. Regular canned vegetables (not low-sodium or reduced-sodium). Regular canned tomato sauce and paste (not low-sodium or reduced-sodium). Regular tomato and vegetable juice (not low-sodium or reduced-sodium). Angie Fava. Olives. Fruits Canned fruit in a light or heavy syrup. Fried fruit. Fruit  in cream or butter sauce. Meat and other protein foods Fatty cuts of meat. Ribs. Fried meat. Berniece Salines. Sausage. Bologna and other processed lunch meats. Salami. Fatback. Hotdogs. Bratwurst. Salted nuts and seeds. Canned beans with added salt. Canned or smoked fish. Whole eggs or egg yolks. Chicken or [place] with skin. Dairy Whole or 2% milk, cream, and half-and-half. Whole or full-fat cream cheese. Whole-fat or sweetened yogurt. Full-fat cheese. Nondairy creamers. Whipped toppings. Processed cheese and cheese spreads. Fats and oils Butter. Stick margarine. Lard. Shortening. Ghee. Bacon fat. Tropical oils, such as coconut, palm kernel, or palm oil. Seasoning and other foods Salted popcorn and pretzels. Onion salt, garlic salt, seasoned salt, table salt, and sea salt. Worcestershire sauce. Tartar sauce. Barbecue sauce. Teriyaki sauce. Soy sauce, including reduced-sodium. Steak sauce. Canned and packaged gravies. Fish sauce. Oyster sauce. Cocktail sauce. Horseradish that you find on the shelf. Ketchup. Mustard. Meat flavorings and tenderizers. Bouillon cubes. Hot sauce and Tabasco sauce. Premade or packaged marinades. Premade or packaged taco seasonings. Relishes. Regular salad dressings. Where to find more information:  National Heart, Lung, and [place]: https://[place]-eaton.com/  American Heart Association: www.heart.org Summary  The DASH eating plan is a healthy eating plan that has been shown to reduce high blood pressure (hypertension). It may also  reduce your risk for type 2 diabetes, heart disease, and stroke.  With the DASH eating plan, you should limit salt (sodium) intake to 2,300 mg a day. If you have hypertension, you may need to reduce your sodium intake to 1,500 mg a day.  When on the DASH eating plan, aim to eat more fresh fruits and vegetables, whole grains, lean proteins, low-fat dairy, and heart-healthy fats.  Work with your health care provider or diet and nutrition specialist  (dietitian) to adjust your eating plan to your individual calorie needs. This information is not intended to replace advice given to you by your health care provider. Make sure you discuss any questions you have with your health care provider. Document Released: 06/25/2011 Document Revised: 06/18/2017 Document Reviewed: 06/29/2016 Elsevier Patient Education  2020 [employer].
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
## 2019-06-27 ENCOUNTER — Other Ambulatory Visit: Payer: Medicare Other

## 2019-06-27 ENCOUNTER — Other Ambulatory Visit: Payer: Self-pay

## 2019-06-27 DIAGNOSIS — E119 Type 2 diabetes mellitus without complications: Secondary | ICD-10-CM

## 2019-06-27 DIAGNOSIS — E782 Mixed hyperlipidemia: Secondary | ICD-10-CM

## 2019-06-28 LAB — COMPLETE METABOLIC PANEL WITH GFR
AG Ratio: 1.9 (calc) (ref 1.0–2.5)
ALT: 15 U/L (ref 6–29)
AST: 14 U/L (ref 10–35)
Albumin: 4.4 g/dL (ref 3.6–5.1)
Alkaline phosphatase (APISO): 70 U/L (ref 37–153)
BUN/Creatinine Ratio: 23 (calc) — ABNORMAL HIGH (ref 6–22)
BUN: 22 mg/dL (ref 7–25)
CO2: 26 mmol/L (ref 20–32)
Calcium: 9.4 mg/dL (ref 8.6–10.4)
Chloride: 108 mmol/L (ref 98–110)
Creat: 0.97 mg/dL — ABNORMAL HIGH (ref 0.60–0.93)
GFR, Est African American: 66 mL/min/{1.73_m2} (ref >=60)
GFR, Est Non African American: 57 mL/min/{1.73_m2} — ABNORMAL LOW (ref >=60)
Globulin: 2.3 g/dL (calc) (ref 1.9–3.7)
Glucose, Bld: 112 mg/dL — ABNORMAL HIGH (ref 65–99)
Potassium: 4.4 mmol/L (ref 3.5–5.3)
Sodium: 144 mmol/L (ref 135–146)
Total Bilirubin: 0.4 mg/dL (ref 0.2–1.2)
Total Protein: 6.7 g/dL (ref 6.1–8.1)

## 2019-06-28 LAB — CBC WITH DIFFERENTIAL/PLATELET
Absolute Monocytes: 853 cells/uL (ref 200–950)
Basophils Absolute: 42 cells/uL (ref 0–200)
Basophils Relative: 0.4 %
Eosinophils Absolute: 218 cells/uL (ref 15–500)
Eosinophils Relative: 2.1 %
HCT: 45.5 % — ABNORMAL HIGH (ref 35.0–45.0)
Hemoglobin: 15.4 g/dL (ref 11.7–15.5)
Lymphs Abs: 2912 cells/uL (ref 850–3900)
MCH: 30.3 pg (ref 27.0–33.0)
MCHC: 33.8 g/dL (ref 32.0–36.0)
MCV: 89.6 fL (ref 80.0–100.0)
MPV: 10.2 fL (ref 7.5–12.5)
Monocytes Relative: 8.2 %
Neutro Abs: 6375 cells/uL (ref 1500–7800)
Neutrophils Relative %: 61.3 %
Platelets: 300 10*3/uL (ref 140–400)
RBC: 5.08 10*6/uL (ref 3.80–5.10)
RDW: 12.6 % (ref 11.0–15.0)
Total Lymphocyte: 28 %
WBC: 10.4 10*3/uL (ref 3.8–10.8)

## 2019-06-28 LAB — HEMOGLOBIN A1C
Hgb A1c MFr Bld: 6.3 % of total Hgb — ABNORMAL HIGH (ref <5.7)
Mean Plasma Glucose: 134 (calc)
eAG (mmol/L): 7.4 (calc)

## 2019-06-28 LAB — LIPID PANEL
Cholesterol: 222 mg/dL — ABNORMAL HIGH (ref <200)
HDL: 54 mg/dL (ref >=50)
LDL Cholesterol (Calc): 145 mg/dL (calc) — ABNORMAL HIGH
Non-HDL Cholesterol (Calc): 168 mg/dL (calc) — ABNORMAL HIGH (ref <130)
Total CHOL/HDL Ratio: 4.1 (calc) (ref <5.0)
Triglycerides: 113 mg/dL (ref <150)

## 2019-09-03 ENCOUNTER — Other Ambulatory Visit: Payer: Self-pay | Admitting: Nurse Practitioner

## 2019-09-03 DIAGNOSIS — I10 Essential (primary) hypertension: Secondary | ICD-10-CM

## 2019-09-03 DIAGNOSIS — E782 Mixed hyperlipidemia: Secondary | ICD-10-CM

## 2019-09-06 ENCOUNTER — Other Ambulatory Visit: Payer: Self-pay | Admitting: *Deleted

## 2019-09-06 DIAGNOSIS — G4700 Insomnia, unspecified: Secondary | ICD-10-CM

## 2019-09-06 DIAGNOSIS — F419 Anxiety disorder, unspecified: Secondary | ICD-10-CM

## 2019-09-06 MED ORDER — ALPRAZOLAM 0.5 MG PO TABS: ORAL_TABLET | ORAL | 0 refills | Status: DC

## 2019-09-06 NOTE — Telephone Encounter (Signed)
Patient requested refill NCSSRS Database Verified LR: 05/31/2019 Contract updated Pended Rx and sent to [facility] for approval.

## 2019-10-23 ENCOUNTER — Other Ambulatory Visit: Payer: Self-pay

## 2019-10-23 DIAGNOSIS — F419 Anxiety disorder, unspecified: Secondary | ICD-10-CM

## 2019-10-23 DIAGNOSIS — G4700 Insomnia, unspecified: Secondary | ICD-10-CM

## 2019-10-23 MED ORDER — ALPRAZOLAM 0.5 MG PO TABS: ORAL_TABLET | ORAL | 0 refills | Status: DC

## 2019-10-23 NOTE — Telephone Encounter (Signed)
Contract mailed to patient. Notation made to patient to return within 30 days.

## 2019-10-23 NOTE — Telephone Encounter (Signed)
Unable to send in 90 day supply, she needs to sign the contract prior to additional refills. We can mail her the contract and she will need to bring it back to office within the next 30 days.

## 2019-10-23 NOTE — Telephone Encounter (Signed)
Patient called and states she needs refill on "Alprazolam". Patient also wanted to know if she could receive 90 day supply. Patient has upcoming appointment 12/22/2019, patient was last in office 06/23/2019. Patient needs Non Opioid agreement updated on file. Please Advise.

## 2019-12-07 ENCOUNTER — Other Ambulatory Visit: Payer: Self-pay | Admitting: *Deleted

## 2019-12-07 DIAGNOSIS — G4700 Insomnia, unspecified: Secondary | ICD-10-CM

## 2019-12-07 DIAGNOSIS — F419 Anxiety disorder, unspecified: Secondary | ICD-10-CM

## 2019-12-07 MED ORDER — ALPRAZOLAM 0.5 MG PO TABS: ORAL_TABLET | ORAL | 0 refills | Status: DC

## 2019-12-07 NOTE — Telephone Encounter (Signed)
Patient requested refill. Has upcoming appointment.  

## 2019-12-22 ENCOUNTER — Ambulatory Visit: Payer: Medicare Other | Admitting: Nurse Practitioner

## 2020-01-05 ENCOUNTER — Ambulatory Visit (INDEPENDENT_AMBULATORY_CARE_PROVIDER_SITE_OTHER): Payer: Medicare Other | Admitting: Nurse Practitioner

## 2020-01-05 ENCOUNTER — Other Ambulatory Visit: Payer: Self-pay

## 2020-01-05 ENCOUNTER — Encounter: Payer: Self-pay | Admitting: Nurse Practitioner

## 2020-01-05 VITALS — BP 120/70 | HR 68 | Temp 97.4°F | Ht 64.0 in | Wt 147.0 lb

## 2020-01-05 DIAGNOSIS — F419 Anxiety disorder, unspecified: Secondary | ICD-10-CM | POA: Diagnosis not present

## 2020-01-05 DIAGNOSIS — Z720 Tobacco use: Secondary | ICD-10-CM | POA: Diagnosis not present

## 2020-01-05 DIAGNOSIS — E782 Mixed hyperlipidemia: Secondary | ICD-10-CM | POA: Diagnosis not present

## 2020-01-05 DIAGNOSIS — E119 Type 2 diabetes mellitus without complications: Secondary | ICD-10-CM

## 2020-01-05 DIAGNOSIS — N1831 Chronic kidney disease, stage 3a: Secondary | ICD-10-CM

## 2020-01-05 DIAGNOSIS — J302 Other seasonal allergic rhinitis: Secondary | ICD-10-CM | POA: Diagnosis not present

## 2020-01-05 DIAGNOSIS — M1990 Unspecified osteoarthritis, unspecified site: Secondary | ICD-10-CM | POA: Diagnosis not present

## 2020-01-05 DIAGNOSIS — I10 Essential (primary) hypertension: Secondary | ICD-10-CM | POA: Diagnosis not present
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
## 2020-01-06 LAB — CBC WITH DIFFERENTIAL/PLATELET
Absolute Monocytes: 892 cells/uL (ref 200–950)
Basophils Absolute: 49 cells/uL (ref 0–200)
Basophils Relative: 0.5 %
Eosinophils Absolute: 157 cells/uL (ref 15–500)
Eosinophils Relative: 1.6 %
HCT: 45.9 % — ABNORMAL HIGH (ref 35.0–45.0)
Hemoglobin: 15.2 g/dL (ref 11.7–15.5)
Lymphs Abs: 2901 cells/uL (ref 850–3900)
MCH: 29.7 pg (ref 27.0–33.0)
MCHC: 33.1 g/dL (ref 32.0–36.0)
MCV: 89.6 fL (ref 80.0–100.0)
MPV: 10.5 fL (ref 7.5–12.5)
Monocytes Relative: 9.1 %
Neutro Abs: 5802 cells/uL (ref 1500–7800)
Neutrophils Relative %: 59.2 %
Platelets: 284 10*3/uL (ref 140–400)
RBC: 5.12 10*6/uL — ABNORMAL HIGH (ref 3.80–5.10)
RDW: 12.8 % (ref 11.0–15.0)
Total Lymphocyte: 29.6 %
WBC: 9.8 10*3/uL (ref 3.8–10.8)

## 2020-01-06 LAB — COMPLETE METABOLIC PANEL WITH GFR
AG Ratio: 1.8 (calc) (ref 1.0–2.5)
ALT: 20 U/L (ref 6–29)
AST: 18 U/L (ref 10–35)
Albumin: 4.4 g/dL (ref 3.6–5.1)
Alkaline phosphatase (APISO): 67 U/L (ref 37–153)
BUN/Creatinine Ratio: 19 (calc) (ref 6–22)
BUN: 20 mg/dL (ref 7–25)
CO2: 29 mmol/L (ref 20–32)
Calcium: 9.8 mg/dL (ref 8.6–10.4)
Chloride: 106 mmol/L (ref 98–110)
Creat: 1.06 mg/dL — ABNORMAL HIGH (ref 0.60–0.93)
GFR, Est African American: 59 mL/min/{1.73_m2} — ABNORMAL LOW (ref >=60)
GFR, Est Non African American: 51 mL/min/{1.73_m2} — ABNORMAL LOW (ref >=60)
Globulin: 2.4 g/dL (calc) (ref 1.9–3.7)
Glucose, Bld: 102 mg/dL — ABNORMAL HIGH (ref 65–99)
Potassium: 4.1 mmol/L (ref 3.5–5.3)
Sodium: 143 mmol/L (ref 135–146)
Total Bilirubin: 0.3 mg/dL (ref 0.2–1.2)
Total Protein: 6.8 g/dL (ref 6.1–8.1)

## 2020-01-06 LAB — HEMOGLOBIN A1C
Hgb A1c MFr Bld: 6.3 % of total Hgb — ABNORMAL HIGH (ref <5.7)
Mean Plasma Glucose: 134 (calc)
eAG (mmol/L): 7.4 (calc)

## 2020-01-06 LAB — LIPID PANEL
Cholesterol: 210 mg/dL — ABNORMAL HIGH (ref <200)
HDL: 52 mg/dL (ref >=50)
LDL Cholesterol (Calc): 133 mg/dL (calc) — ABNORMAL HIGH
Non-HDL Cholesterol (Calc): 158 mg/dL (calc) — ABNORMAL HIGH (ref <130)
Total CHOL/HDL Ratio: 4 (calc) (ref <5.0)
Triglycerides: 136 mg/dL (ref <150)

## 2020-01-08 ENCOUNTER — Ambulatory Visit (INDEPENDENT_AMBULATORY_CARE_PROVIDER_SITE_OTHER): Payer: Medicare Other | Admitting: Nurse Practitioner

## 2020-01-08 ENCOUNTER — Other Ambulatory Visit: Payer: Self-pay

## 2020-01-08 ENCOUNTER — Encounter: Payer: Self-pay | Admitting: Nurse Practitioner

## 2020-01-08 DIAGNOSIS — Z0000 Encounter for general adult medical examination without abnormal findings: Secondary | ICD-10-CM | POA: Diagnosis not present

## 2020-01-08 NOTE — Progress Notes (Signed)
    This service is provided via telemedicine  No vital signs collected/recorded due to the encounter was a telemedicine visit.   Location of patient (ex: home, work): Home.   Patient consents to a telephone visit: Yes.  Location of the provider (ex: office, home):  [facility].  Name of any referring provider: N/A  Names of all persons participating in the telemedicine service and their role in the encounter: Patient, Heriberto Antigua, RMA, Sherrie Mustache, NP.    Time spent on call: 8 minutes spent on the phone with Medical Assistant.

## 2020-01-08 NOTE — Patient Instructions (Signed)
Glenda Abbott , Thank you for taking time to come for your Medicare Wellness Visit. I appreciate your ongoing commitment to your health goals. Please review the following plan we discussed and let me know if I can assist you in the future.   Screening recommendations/referrals: Colonoscopy aged out Mammogram aged out Bone Density you have declined Recommended yearly ophthalmology/optometry visit for glaucoma screening and checkup Recommended yearly dental visit for hygiene and checkup  Vaccinations: Influenza vaccine- declined due to egg allergy Pneumococcal vaccine declined  Tdap vaccine RECOMMENDED- to get at your local pharamcy Shingles vaccine RECOMMENDED to get at your local pharmacy     Advanced directives: on file.   Conditions/risks identified: cardiovascular risk, smoker- encouraged cessation, hypertension, hyperlipidemia, advanced age  Next appointment: 1 year.    Preventive Care 77 Years and Older, Female Preventive care refers to lifestyle choices and visits with your health care provider that can promote health and wellness. What does preventive care include?  A yearly physical exam. This is also called an annual well check.  Dental exams once or twice a year.  Routine eye exams. Ask your health care provider how often you should have your eyes checked.  Personal lifestyle choices, including:  Daily care of your teeth and gums.  Regular physical activity.  Eating a healthy diet.  Avoiding tobacco and drug use.  Limiting alcohol use.  Practicing safe sex.  Taking low-dose aspirin every day.  Taking vitamin and mineral supplements as recommended by your health care provider. What happens during an annual well check? The services and screenings done by your health care provider during your annual well check will depend on your age, overall health, lifestyle risk factors, and family history of disease. Counseling  Your health care provider may ask you  questions about your:  Alcohol use.  Tobacco use.  Drug use.  Emotional well-being.  Home and relationship well-being.  Sexual activity.  Eating habits.  History of falls.  Memory and ability to understand (cognition).  Work and work Statistician.  Reproductive health. Screening  You may have the following tests or measurements:  Height, weight, and BMI.  Blood pressure.  Lipid and cholesterol levels. These may be checked every 5 years, or more frequently if you are over 77 years old.  Skin check.  Lung cancer screening. You may have this screening every year starting at age 50 if you have a 30-pack-year history of smoking and currently smoke or have quit within the past 15 years.  Fecal occult blood test (FOBT) of the stool. You may have this test every year starting at age 77.  Flexible sigmoidoscopy or colonoscopy. You may have a sigmoidoscopy every 5 years or a colonoscopy every 10 years starting at age 77.  Hepatitis C blood test.  Hepatitis B blood test.  Sexually transmitted disease (STD) testing.  Diabetes screening. This is done by checking your blood sugar (glucose) after you have not eaten for a while (fasting). You may have this done every 1-3 years.  Bone density scan. This is done to screen for osteoporosis. You may have this done starting at age 77.  Mammogram. This may be done every 1-2 years. Talk to your health care provider about how often you should have regular mammograms. Talk with your health care provider about your test results, treatment options, and if necessary, the need for more tests. Vaccines  Your health care provider may recommend certain vaccines, such as:  Influenza vaccine. This is recommended every year.  Tetanus, diphtheria, and acellular pertussis (Tdap, Td) vaccine. You may need a Td booster every 10 years.  Zoster vaccine. You may need this after age 77.  Pneumococcal 13-valent conjugate (PCV13) vaccine. One dose is  recommended after age 15.  Pneumococcal polysaccharide (PPSV23) vaccine. One dose is recommended after age 77. Talk to your health care provider about which screenings and vaccines you need and how often you need them. This information is not intended to replace advice given to you by your health care provider. Make sure you discuss any questions you have with your health care provider. Document Released: 08/02/2015 Document Revised: 03/25/2016 Document Reviewed: 05/07/2015 Elsevier Interactive Patient Education  2017 [place] Prevention in the Home Falls can cause injuries. They can happen to people of all ages. There are many things you can do to make your home safe and to help prevent falls. What can I do on the outside of my home?  Regularly fix the edges of walkways and driveways and fix any cracks.  Remove anything that might make you trip as you walk through a door, such as a raised step or threshold.  Trim any bushes or trees on the path to your home.  Use bright outdoor lighting.  Clear any walking paths of anything that might make someone trip, such as rocks or tools.  Regularly check to see if handrails are loose or broken. Make sure that both sides of any steps have handrails.  Any raised decks and porches should have guardrails on the edges.  Have any leaves, snow, or ice cleared regularly.  Use sand or salt on walking paths during winter.  Clean up any spills in your garage right away. This includes oil or grease spills. What can I do in the bathroom?  Use night lights.  Install grab bars by the toilet and in the tub and shower. Do not use towel bars as grab bars.  Use non-skid mats or decals in the tub or shower.  If you need to sit down in the shower, use a plastic, non-slip stool.  Keep the floor dry. Clean up any water that spills on the floor as soon as it happens.  Remove soap buildup in the tub or shower regularly.  Attach bath mats  securely with double-sided non-slip rug tape.  Do not have throw rugs and other things on the floor that can make you trip. What can I do in the bedroom?  Use night lights.  Make sure that you have a light by your bed that is easy to reach.  Do not use any sheets or blankets that are too big for your bed. They should not hang down onto the floor.  Have a firm chair that has side arms. You can use this for support while you get dressed.  Do not have throw rugs and other things on the floor that can make you trip. What can I do in the kitchen?  Clean up any spills right away.  Avoid walking on wet floors.  Keep items that you use a lot in easy-to-reach places.  If you need to reach something above you, use a strong step stool that has a grab bar.  Keep electrical cords out of the way.  Do not use floor polish or wax that makes floors slippery. If you must use wax, use non-skid floor wax.  Do not have throw rugs and other things on the floor that can make you trip. What can I do  with my stairs?  Do not leave any items on the stairs.  Make sure that there are handrails on both sides of the stairs and use them. Fix handrails that are broken or loose. Make sure that handrails are as long as the stairways.  Check any carpeting to make sure that it is firmly attached to the stairs. Fix any carpet that is loose or worn.  Avoid having throw rugs at the top or bottom of the stairs. If you do have throw rugs, attach them to the floor with carpet tape.  Make sure that you have a light switch at the top of the stairs and the bottom of the stairs. If you do not have them, ask someone to add them for you. What else can I do to help prevent falls?  Wear shoes that:  Do not have high heels.  Have rubber bottoms.  Are comfortable and fit you well.  Are closed at the toe. Do not wear sandals.  If you use a stepladder:  Make sure that it is fully opened. Do not climb a closed  stepladder.  Make sure that both sides of the stepladder are locked into place.  Ask someone to hold it for you, if possible.  Clearly mark and make sure that you can see:  Any grab bars or handrails.  First and last steps.  Where the edge of each step is.  Use tools that help you move around (mobility aids) if they are needed. These include:  Canes.  Walkers.  Scooters.  Crutches.  Turn on the lights when you go into a dark area. Replace any light bulbs as soon as they burn out.  Set up your furniture so you have a clear path. Avoid moving your furniture around.  If any of your floors are uneven, fix them.  If there are any pets around you, be aware of where they are.  Review your medicines with your doctor. Some medicines can make you feel dizzy. This can increase your chance of falling. Ask your doctor what other things that you can do to help prevent falls. This information is not intended to replace advice given to you by your health care provider. Make sure you discuss any questions you have with your health care provider. Document Released: 05/02/2009 Document Revised: 12/12/2015 Document Reviewed: 08/10/2014 Elsevier Interactive Patient Education  2017 [employer].
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
## 2020-01-10 ENCOUNTER — Telehealth: Payer: Self-pay

## 2020-01-10 NOTE — Telephone Encounter (Signed)
Glenda Abbott, Glenda Abbott are scheduled for a virtual visit with your provider today.    Just as we do with appointments in the office, we must obtain your consent to participate.  Your consent will be active for this visit and any virtual visit you may have with one of our providers in the next 365 days.    If you have a MyChart account, I can also send a copy of this consent to you electronically.  All virtual visits are billed to your insurance company just like a traditional visit in the office.  As this is a virtual visit, video technology does not allow for your provider to perform a traditional examination.  This may limit your provider's ability to fully assess your condition.  If your provider identifies any concerns that need to be evaluated in person or the need to arrange testing such as labs, EKG, etc, we will make arrangements to do so.    Although advances in technology are sophisticated, we cannot ensure that it will always work on either your end or our end.  If the connection with a video visit is poor, we may have to switch to a telephone visit.  With either a video or telephone visit, we are not always able to ensure that we have a secure connection.   I need to obtain your verbal consent now.   Are you willing to proceed with your visit today?   Glenda Abbott has provided verbal consent on 01/08/2020 for a virtual visit (video or telephone).   Glenda Abbott, [place] 01/08/2020  1:00pm

## 2020-02-13 ENCOUNTER — Other Ambulatory Visit: Payer: Self-pay | Admitting: *Deleted

## 2020-02-13 DIAGNOSIS — G4700 Insomnia, unspecified: Secondary | ICD-10-CM

## 2020-02-13 DIAGNOSIS — F419 Anxiety disorder, unspecified: Secondary | ICD-10-CM

## 2020-02-13 MED ORDER — ALPRAZOLAM 0.5 MG PO TABS: ORAL_TABLET | ORAL | 0 refills | Status: DC

## 2020-02-13 NOTE — Telephone Encounter (Signed)
Patient requested refill Epic LR: 12/07/2019 Pended Rx and sent to [facility] for approval.

## 2020-03-05 ENCOUNTER — Other Ambulatory Visit: Payer: Self-pay | Admitting: *Deleted

## 2020-03-05 DIAGNOSIS — E782 Mixed hyperlipidemia: Secondary | ICD-10-CM

## 2020-03-05 DIAGNOSIS — I10 Essential (primary) hypertension: Secondary | ICD-10-CM

## 2020-03-05 MED ORDER — EZETIMIBE 10 MG PO TABS: 10.0000 mg | ORAL_TABLET | Freq: Every day | ORAL | 1 refills | Status: DC

## 2020-03-05 MED ORDER — AMLODIPINE BESY-BENAZEPRIL HCL 10-40 MG PO CAPS: ORAL_CAPSULE | ORAL | 1 refills | Status: DC

## 2020-03-05 NOTE — Telephone Encounter (Signed)
Received Rx refill Request from Pharmacy.

## 2020-04-04 ENCOUNTER — Other Ambulatory Visit: Payer: Self-pay | Admitting: *Deleted

## 2020-04-04 DIAGNOSIS — F419 Anxiety disorder, unspecified: Secondary | ICD-10-CM

## 2020-04-04 DIAGNOSIS — G4700 Insomnia, unspecified: Secondary | ICD-10-CM

## 2020-04-04 MED ORDER — ALPRAZOLAM 0.5 MG PO TABS: ORAL_TABLET | ORAL | 0 refills | Status: DC

## 2020-04-04 NOTE — Telephone Encounter (Signed)
Patient requested refill Epic LR: 02/13/2020 Contract on [employer] Rx and sent to [place] for approval.

## 2020-06-03 ENCOUNTER — Other Ambulatory Visit: Payer: Self-pay | Admitting: *Deleted

## 2020-06-03 DIAGNOSIS — F419 Anxiety disorder, unspecified: Secondary | ICD-10-CM

## 2020-06-03 DIAGNOSIS — G4700 Insomnia, unspecified: Secondary | ICD-10-CM

## 2020-06-03 MED ORDER — ALPRAZOLAM 0.5 MG PO TABS: ORAL_TABLET | ORAL | 0 refills | Status: DC

## 2020-06-03 NOTE — Telephone Encounter (Signed)
Patient called requesting Rx. Epic LR: 04/04/2020 Contract on Glenda Abbott Rx and sent to [place] for approval.

## 2020-06-19 DIAGNOSIS — Z23 Encounter for immunization: Secondary | ICD-10-CM | POA: Diagnosis not present

## 2020-07-03 ENCOUNTER — Other Ambulatory Visit: Payer: Self-pay

## 2020-07-03 ENCOUNTER — Ambulatory Visit (INDEPENDENT_AMBULATORY_CARE_PROVIDER_SITE_OTHER): Payer: Medicare Other | Admitting: Nurse Practitioner

## 2020-07-03 ENCOUNTER — Encounter: Payer: Self-pay | Admitting: Nurse Practitioner

## 2020-07-03 VITALS — BP 140/80 | HR 72 | Temp 97.3°F | Ht 64.0 in | Wt 177.0 lb

## 2020-07-03 DIAGNOSIS — M8949 Other hypertrophic osteoarthropathy, multiple sites: Secondary | ICD-10-CM

## 2020-07-03 DIAGNOSIS — I10 Essential (primary) hypertension: Secondary | ICD-10-CM | POA: Diagnosis not present

## 2020-07-03 DIAGNOSIS — M792 Neuralgia and neuritis, unspecified: Secondary | ICD-10-CM | POA: Diagnosis not present

## 2020-07-03 DIAGNOSIS — N1831 Chronic kidney disease, stage 3a: Secondary | ICD-10-CM

## 2020-07-03 DIAGNOSIS — E119 Type 2 diabetes mellitus without complications: Secondary | ICD-10-CM | POA: Diagnosis not present

## 2020-07-03 DIAGNOSIS — E6609 Other obesity due to excess calories: Secondary | ICD-10-CM | POA: Diagnosis not present

## 2020-07-03 DIAGNOSIS — Z6830 Body mass index (BMI) 30.0-30.9, adult: Secondary | ICD-10-CM | POA: Diagnosis not present

## 2020-07-03 DIAGNOSIS — M150 Primary generalized (osteo)arthritis: Secondary | ICD-10-CM

## 2020-07-03 DIAGNOSIS — E1122 Type 2 diabetes mellitus with diabetic chronic kidney disease: Secondary | ICD-10-CM | POA: Diagnosis not present

## 2020-07-03 DIAGNOSIS — E782 Mixed hyperlipidemia: Secondary | ICD-10-CM

## 2020-07-03 DIAGNOSIS — H9193 Unspecified hearing loss, bilateral: Secondary | ICD-10-CM | POA: Diagnosis not present

## 2020-07-03 DIAGNOSIS — F419 Anxiety disorder, unspecified: Secondary | ICD-10-CM

## 2020-07-03 DIAGNOSIS — E66811 Obesity, class 1: Secondary | ICD-10-CM

## 2020-07-03 DIAGNOSIS — M159 Polyosteoarthritis, unspecified: Secondary | ICD-10-CM

## 2020-07-03 NOTE — Patient Instructions (Signed)
Need to make appt with Dr Gershon Crane for diabetic eye exam
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
## 2020-07-04 ENCOUNTER — Encounter: Payer: Self-pay | Admitting: Nurse Practitioner

## 2020-07-04 DIAGNOSIS — E1122 Type 2 diabetes mellitus with diabetic chronic kidney disease: Secondary | ICD-10-CM | POA: Insufficient documentation

## 2020-07-04 DIAGNOSIS — N1831 Chronic kidney disease, stage 3a: Secondary | ICD-10-CM

## 2020-07-04 HISTORY — DX: Chronic kidney disease, stage 3a: N18.31

## 2020-07-04 LAB — COMPLETE METABOLIC PANEL WITH GFR
AG Ratio: 1.7 (calc) (ref 1.0–2.5)
ALT: 25 U/L (ref 6–29)
AST: 18 U/L (ref 10–35)
Albumin: 4.3 g/dL (ref 3.6–5.1)
Alkaline phosphatase (APISO): 70 U/L (ref 37–153)
BUN/Creatinine Ratio: 14 (calc) (ref 6–22)
BUN: 15 mg/dL (ref 7–25)
CO2: 31 mmol/L (ref 20–32)
Calcium: 9.7 mg/dL (ref 8.6–10.4)
Chloride: 105 mmol/L (ref 98–110)
Creat: 1.04 mg/dL — ABNORMAL HIGH (ref 0.60–0.93)
GFR, Est African American: 60 mL/min/{1.73_m2} (ref >=60)
GFR, Est Non African American: 52 mL/min/{1.73_m2} — ABNORMAL LOW (ref >=60)
Globulin: 2.5 g/dL (calc) (ref 1.9–3.7)
Glucose, Bld: 88 mg/dL (ref 65–139)
Potassium: 4 mmol/L (ref 3.5–5.3)
Sodium: 142 mmol/L (ref 135–146)
Total Bilirubin: 0.4 mg/dL (ref 0.2–1.2)
Total Protein: 6.8 g/dL (ref 6.1–8.1)

## 2020-07-04 LAB — CBC WITH DIFFERENTIAL/PLATELET
Absolute Monocytes: 837 cells/uL (ref 200–950)
Basophils Absolute: 64 cells/uL (ref 0–200)
Basophils Relative: 0.6 %
Eosinophils Absolute: 180 cells/uL (ref 15–500)
Eosinophils Relative: 1.7 %
HCT: 43.9 % (ref 35.0–45.0)
Hemoglobin: 14.9 g/dL (ref 11.7–15.5)
Lymphs Abs: 3689 cells/uL (ref 850–3900)
MCH: 29.9 pg (ref 27.0–33.0)
MCHC: 33.9 g/dL (ref 32.0–36.0)
MCV: 88 fL (ref 80.0–100.0)
MPV: 9.8 fL (ref 7.5–12.5)
Monocytes Relative: 7.9 %
Neutro Abs: 5830 cells/uL (ref 1500–7800)
Neutrophils Relative %: 55 %
Platelets: 318 10*3/uL (ref 140–400)
RBC: 4.99 10*6/uL (ref 3.80–5.10)
RDW: 12.3 % (ref 11.0–15.0)
Total Lymphocyte: 34.8 %
WBC: 10.6 10*3/uL (ref 3.8–10.8)

## 2020-07-04 LAB — HEMOGLOBIN A1C
Hgb A1c MFr Bld: 7.1 % of total Hgb — ABNORMAL HIGH (ref <5.7)
Mean Plasma Glucose: 157 mg/dL
eAG (mmol/L): 8.7 mmol/L

## 2020-07-10 ENCOUNTER — Ambulatory Visit: Payer: Medicare Other | Admitting: Nurse Practitioner

## 2020-08-02 ENCOUNTER — Other Ambulatory Visit: Payer: Self-pay | Admitting: *Deleted

## 2020-08-02 DIAGNOSIS — G4700 Insomnia, unspecified: Secondary | ICD-10-CM

## 2020-08-02 DIAGNOSIS — F419 Anxiety disorder, unspecified: Secondary | ICD-10-CM

## 2020-08-02 MED ORDER — ALPRAZOLAM 0.5 MG PO TABS: ORAL_TABLET | ORAL | 0 refills | Status: DC

## 2020-08-02 NOTE — Telephone Encounter (Signed)
Patient requested refill.  Epic LR: 06/03/2020 Contract on [employer] Rx and sent to [place] for approval.

## 2020-09-17 ENCOUNTER — Other Ambulatory Visit: Payer: Self-pay

## 2020-09-17 DIAGNOSIS — G4700 Insomnia, unspecified: Secondary | ICD-10-CM

## 2020-09-17 DIAGNOSIS — F419 Anxiety disorder, unspecified: Secondary | ICD-10-CM

## 2020-09-17 MED ORDER — ALPRAZOLAM 0.5 MG PO TABS: ORAL_TABLET | ORAL | 0 refills | Status: DC

## 2020-09-17 NOTE — Telephone Encounter (Signed)
RX last filled on 08/02/2020, Treatment agreement on file from 10/23/2019

## 2020-11-05 ENCOUNTER — Other Ambulatory Visit: Payer: Self-pay | Admitting: *Deleted

## 2020-11-05 DIAGNOSIS — F419 Anxiety disorder, unspecified: Secondary | ICD-10-CM

## 2020-11-05 DIAGNOSIS — G4700 Insomnia, unspecified: Secondary | ICD-10-CM

## 2020-11-05 MED ORDER — ALPRAZOLAM 0.5 MG PO TABS: ORAL_TABLET | ORAL | 1 refills | Status: DC

## 2020-11-05 NOTE — Telephone Encounter (Signed)
Patient requested refill.  Pended Rx and sent to [place] for approval.  

## 2020-12-02 ENCOUNTER — Other Ambulatory Visit: Payer: Self-pay | Admitting: Nurse Practitioner

## 2020-12-02 DIAGNOSIS — I10 Essential (primary) hypertension: Secondary | ICD-10-CM

## 2020-12-02 DIAGNOSIS — E782 Mixed hyperlipidemia: Secondary | ICD-10-CM

## 2020-12-11 DIAGNOSIS — Z23 Encounter for immunization: Secondary | ICD-10-CM | POA: Diagnosis not present

## 2021-01-09 ENCOUNTER — Ambulatory Visit (INDEPENDENT_AMBULATORY_CARE_PROVIDER_SITE_OTHER): Payer: Medicare Other | Admitting: Nurse Practitioner

## 2021-01-09 ENCOUNTER — Telehealth: Payer: Self-pay

## 2021-01-09 ENCOUNTER — Other Ambulatory Visit: Payer: Self-pay

## 2021-01-09 DIAGNOSIS — Z0000 Encounter for general adult medical examination without abnormal findings: Secondary | ICD-10-CM | POA: Diagnosis not present

## 2021-01-09 NOTE — Telephone Encounter (Signed)
[name], handrich are scheduled for a virtual visit with your provider today.    Just as we do with appointments in the office, we must obtain your consent to participate.  Your consent will be active for this visit and any virtual visit you may have with one of our providers in the next 365 days.    If you have a MyChart account, I can also send a copy of this consent to you electronically.  All virtual visits are billed to your insurance company just like a traditional visit in the office.  As this is a virtual visit, video technology does not allow for your provider to perform a traditional examination.  This may limit your provider's ability to fully assess your condition.  If your provider identifies any concerns that need to be evaluated in person or the need to arrange testing such as labs, EKG, etc, we will make arrangements to do so.    Although advances in technology are sophisticated, we cannot ensure that it will always work on either your end or our end.  If the connection with a video visit is poor, we may have to switch to a telephone visit.  With either a video or telephone visit, we are not always able to ensure that we have a secure connection.   I need to obtain your verbal consent now.   Are you willing to proceed with your visit today?   Glenda Abbott has provided verbal consent on 01/09/2021 for a virtual visit (video or telephone).   Carroll Kinds, CMA 01/09/2021  1:26 PM
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
## 2021-01-09 NOTE — Progress Notes (Signed)
This service is provided via telemedicine  No vital signs collected/recorded due to the encounter was a telemedicine visit.   Location of patient (ex: home, work):  Home  Patient consents to a telephone visit:  Yes, see encounter dated 01/09/2021  Location of the provider (ex: office, home):  [place]  Name of any referring provider:  N/A  Names of all persons participating in the telemedicine service and their role in the encounter:  Sherrie Mustache, Nurse Practitioner, Carroll Kinds, CMA, and patient.    Time spent on call:  9 minutes with medical assistant

## 2021-01-09 NOTE — Patient Instructions (Signed)
Glenda Abbott , Thank you for taking time to come for your Medicare Wellness Visit. I appreciate your ongoing commitment to your health goals. Please review the following plan we discussed and let me know if I can assist you in the future.   Screening recommendations/referrals: Colonoscopy aged out Mammogram aged out Bone Density you have declined Recommended yearly ophthalmology/optometry visit for glaucoma screening and checkup Recommended yearly dental visit for hygiene and checkup  Vaccinations: Influenza vaccine unable to take  Pneumococcal vaccine RECOMMENDED- you have decline.  Tdap vaccine RECOMMENDED- you have decline Shingles vaccine RECOMMENDED- you have declined.     Advanced directives: on file.   Conditions/risks identified: advanced age, smoking, hypertension, high cholesterol, diabetes.   Next appointment: 1 year for AWV   Preventive Care 16 Years and Older, Female Preventive care refers to lifestyle choices and visits with your health care provider that can promote health and wellness. What does preventive care include? A yearly physical exam. This is also called an annual well check. Dental exams once or twice a year. Routine eye exams. Ask your health care provider how often you should have your eyes checked. Personal lifestyle choices, including: Daily care of your teeth and gums. Regular physical activity. Eating a healthy diet. Avoiding tobacco and drug use. Limiting alcohol use. Practicing safe sex. Taking low-dose aspirin every day. Taking vitamin and mineral supplements as recommended by your health care provider. What happens during an annual well check? The services and screenings done by your health care provider during your annual well check will depend on your age, overall health, lifestyle risk factors, and family history of disease. Counseling  Your health care provider may ask you questions about your: Alcohol use. Tobacco use. Drug  use. Emotional well-being. Home and relationship well-being. Sexual activity. Eating habits. History of falls. Memory and ability to understand (cognition). Work and work Statistician. Reproductive health. Screening  You may have the following tests or measurements: Height, weight, and BMI. Blood pressure. Lipid and cholesterol levels. These may be checked every 5 years, or more frequently if you are over 8 years old. Skin check. Lung cancer screening. You may have this screening every year starting at age 81 if you have a 30-pack-year history of smoking and currently smoke or have quit within the past 15 years. Fecal occult blood test (FOBT) of the stool. You may have this test every year starting at age 59. Flexible sigmoidoscopy or colonoscopy. You may have a sigmoidoscopy every 5 years or a colonoscopy every 10 years starting at age 45. Hepatitis C blood test. Hepatitis B blood test. Sexually transmitted disease (STD) testing. Diabetes screening. This is done by checking your blood sugar (glucose) after you have not eaten for a while (fasting). You may have this done every 1-3 years. Bone density scan. This is done to screen for osteoporosis. You may have this done starting at age 69. Mammogram. This may be done every 1-2 years. Talk to your health care provider about how often you should have regular mammograms. Talk with your health care provider about your test results, treatment options, and if necessary, the need for more tests. Vaccines  Your health care provider may recommend certain vaccines, such as: Influenza vaccine. This is recommended every year. Tetanus, diphtheria, and acellular pertussis (Tdap, Td) vaccine. You may need a Td booster every 10 years. Zoster vaccine. You may need this after age 33. Pneumococcal 13-valent conjugate (PCV13) vaccine. One dose is recommended after age 59. Pneumococcal polysaccharide (PPSV23) vaccine.  One dose is recommended after age  9. Talk to your health care provider about which screenings and vaccines you need and how often you need them. This information is not intended to replace advice given to you by your health care provider. Make sure you discuss any questions you have with your health care provider. Document Released: 08/02/2015 Document Revised: 03/25/2016 Document Reviewed: 05/07/2015 Elsevier Interactive Patient Education  2017 [place] Prevention in the Home Falls can cause injuries. They can happen to people of all ages. There are many things you can do to make your home safe and to help prevent falls. What can I do on the outside of my home? Regularly fix the edges of walkways and driveways and fix any cracks. Remove anything that might make you trip as you walk through a door, such as a raised step or threshold. Trim any bushes or trees on the path to your home. Use bright outdoor lighting. Clear any walking paths of anything that might make someone trip, such as rocks or tools. Regularly check to see if handrails are loose or broken. Make sure that both sides of any steps have handrails. Any raised decks and porches should have guardrails on the edges. Have any leaves, snow, or ice cleared regularly. Use sand or salt on walking paths during winter. Clean up any spills in your garage right away. This includes oil or grease spills. What can I do in the bathroom? Use night lights. Install grab bars by the toilet and in the tub and shower. Do not use towel bars as grab bars. Use non-skid mats or decals in the tub or shower. If you need to sit down in the shower, use a plastic, non-slip stool. Keep the floor dry. Clean up any water that spills on the floor as soon as it happens. Remove soap buildup in the tub or shower regularly. Attach bath mats securely with double-sided non-slip rug tape. Do not have throw rugs and other things on the floor that can make you trip. What can I do in the  bedroom? Use night lights. Make sure that you have a light by your bed that is easy to reach. Do not use any sheets or blankets that are too big for your bed. They should not hang down onto the floor. Have a firm chair that has side arms. You can use this for support while you get dressed. Do not have throw rugs and other things on the floor that can make you trip. What can I do in the kitchen? Clean up any spills right away. Avoid walking on wet floors. Keep items that you use a lot in easy-to-reach places. If you need to reach something above you, use a strong step stool that has a grab bar. Keep electrical cords out of the way. Do not use floor polish or wax that makes floors slippery. If you must use wax, use non-skid floor wax. Do not have throw rugs and other things on the floor that can make you trip. What can I do with my stairs? Do not leave any items on the stairs. Make sure that there are handrails on both sides of the stairs and use them. Fix handrails that are broken or loose. Make sure that handrails are as long as the stairways. Check any carpeting to make sure that it is firmly attached to the stairs. Fix any carpet that is loose or worn. Avoid having throw rugs at the top or bottom of  the stairs. If you do have throw rugs, attach them to the floor with carpet tape. Make sure that you have a light switch at the top of the stairs and the bottom of the stairs. If you do not have them, ask someone to add them for you. What else can I do to help prevent falls? Wear shoes that: Do not have high heels. Have rubber bottoms. Are comfortable and fit you well. Are closed at the toe. Do not wear sandals. If you use a stepladder: Make sure that it is fully opened. Do not climb a closed stepladder. Make sure that both sides of the stepladder are locked into place. Ask someone to hold it for you, if possible. Clearly mark and make sure that you can see: Any grab bars or  handrails. First and last steps. Where the edge of each step is. Use tools that help you move around (mobility aids) if they are needed. These include: Canes. Walkers. Scooters. Crutches. Turn on the lights when you go into a dark area. Replace any light bulbs as soon as they burn out. Set up your furniture so you have a clear path. Avoid moving your furniture around. If any of your floors are uneven, fix them. If there are any pets around you, be aware of where they are. Review your medicines with your doctor. Some medicines can make you feel dizzy. This can increase your chance of falling. Ask your doctor what other things that you can do to help prevent falls. This information is not intended to replace advice given to you by your health care provider. Make sure you discuss any questions you have with your health care provider. Document Released: 05/02/2009 Document Revised: 12/12/2015 Document Reviewed: 08/10/2014 Elsevier Interactive Patient Education  2017 [employer].

## 2021-01-23 ENCOUNTER — Other Ambulatory Visit: Payer: Self-pay | Admitting: *Deleted

## 2021-01-23 DIAGNOSIS — F419 Anxiety disorder, unspecified: Secondary | ICD-10-CM

## 2021-01-23 DIAGNOSIS — G4700 Insomnia, unspecified: Secondary | ICD-10-CM

## 2021-01-23 MED ORDER — ALPRAZOLAM 0.5 MG PO TABS: ORAL_TABLET | ORAL | 1 refills | Status: DC

## 2021-01-23 NOTE — Telephone Encounter (Signed)
Patient called requesting a refill.  Epic LR: 11/05/2020 Contract needs to be updated.   Tried to schedule an appointment but patient stated that she has not felt well since getting the last Covid injection and afraid to drive.  Stated that she will schedule an appointment once feeling better.   Pended Rx and sent to [facility] for approval.

## 2021-03-08 ENCOUNTER — Other Ambulatory Visit: Payer: Self-pay | Admitting: Nurse Practitioner

## 2021-03-08 DIAGNOSIS — E782 Mixed hyperlipidemia: Secondary | ICD-10-CM

## 2021-04-29 ENCOUNTER — Encounter: Payer: Self-pay | Admitting: *Deleted

## 2021-04-29 NOTE — Telephone Encounter (Signed)
error 

## 2021-04-30 ENCOUNTER — Other Ambulatory Visit: Payer: Self-pay

## 2021-04-30 ENCOUNTER — Ambulatory Visit: Payer: Medicare Other | Admitting: Family

## 2021-04-30 ENCOUNTER — Encounter: Payer: Self-pay | Admitting: Adult Health

## 2021-04-30 ENCOUNTER — Ambulatory Visit (INDEPENDENT_AMBULATORY_CARE_PROVIDER_SITE_OTHER): Payer: Medicare Other | Admitting: Adult Health

## 2021-04-30 VITALS — BP 120/70 | HR 97 | Temp 97.5°F | Resp 16 | Ht 64.0 in | Wt 184.0 lb

## 2021-04-30 DIAGNOSIS — N1831 Chronic kidney disease, stage 3a: Secondary | ICD-10-CM | POA: Diagnosis not present

## 2021-04-30 DIAGNOSIS — E6609 Other obesity due to excess calories: Secondary | ICD-10-CM | POA: Diagnosis not present

## 2021-04-30 DIAGNOSIS — Z6830 Body mass index (BMI) 30.0-30.9, adult: Secondary | ICD-10-CM | POA: Diagnosis not present

## 2021-04-30 DIAGNOSIS — E1122 Type 2 diabetes mellitus with diabetic chronic kidney disease: Secondary | ICD-10-CM

## 2021-04-30 DIAGNOSIS — F419 Anxiety disorder, unspecified: Secondary | ICD-10-CM

## 2021-04-30 DIAGNOSIS — E782 Mixed hyperlipidemia: Secondary | ICD-10-CM

## 2021-04-30 DIAGNOSIS — I10 Essential (primary) hypertension: Secondary | ICD-10-CM

## 2021-04-30 MED ORDER — ALPRAZOLAM 0.5 MG PO TABS: ORAL_TABLET | ORAL | 0 refills | Status: DC
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
## 2021-04-30 NOTE — Patient Instructions (Signed)
Generalized Anxiety Disorder, Adult Generalized anxiety disorder (GAD) is a mental health condition. Unlike normal worries, anxiety related to GAD is not triggered by a specific event. These worries do not fade or get better with time. GAD interferes with relationships, work, and school. GAD symptoms can vary from mild to severe. People with severe GAD can have intense waves of anxiety with physical symptoms that are similar to panic attacks. What are the causes? The exact cause of GAD is not known, but the following are believed to have an impact: Differences in natural brain chemicals. Genes passed down from parents to children. Differences in the way threats are perceived. Development during childhood. Personality. What increases the risk? The following factors may make you more likely to develop this condition: Being female. Having a family history of anxiety disorders. Being very shy. Experiencing very stressful life events, such as the death of a loved one. Having a very stressful family environment. What are the signs or symptoms? People with GAD often worry excessively about many things in their lives, such as their health and family. Symptoms may also include: Mental and emotional symptoms: Worrying excessively about natural disasters. Fear of being late. Difficulty concentrating. Fears that others are judging your performance. Physical symptoms: Fatigue. Headaches, muscle tension, muscle twitches, trembling, or feeling shaky. Feeling like your heart is pounding or beating very fast. Feeling out of breath or like you cannot take a deep breath. Having trouble falling asleep or staying asleep, or experiencing restlessness. Sweating. Nausea, diarrhea, or irritable bowel syndrome (IBS). Behavioral symptoms: Experiencing erratic moods or irritability. Avoidance of new situations. Avoidance of people. Extreme difficulty making decisions. How is this diagnosed? This condition  is diagnosed based on your symptoms and medical history. You will also have a physical exam. Your health care provider may perform tests to rule out other possible causes of your symptoms. To be diagnosed with GAD, a person must have anxiety that: Is out of his or her control. Affects several different aspects of his or her life, such as work and relationships. Causes distress that makes him or her unable to take part in normal activities. Includes at least three symptoms of GAD, such as restlessness, fatigue, trouble concentrating, irritability, muscle tension, or sleep problems. Before your health care provider can confirm a diagnosis of GAD, these symptoms must be present more days than they are not, and they must last for 6 months or longer. How is this treated? This condition may be treated with: Medicine. Antidepressant medicine is usually prescribed for long-term daily control. Anti-anxiety medicines may be added in severe cases, especially when panic attacks occur. Talk therapy (psychotherapy). Certain types of talk therapy can be helpful in treating GAD by providing support, education, and guidance. Options include: Cognitive behavioral therapy (CBT). People learn coping skills and self-calming techniques to ease their physical symptoms. They learn to identify unrealistic thoughts and behaviors and to replace them with more appropriate thoughts and behaviors. Acceptance and commitment therapy (ACT). This treatment teaches people how to be mindful as a way to cope with unwanted thoughts and feelings. Biofeedback. This process trains you to manage your body's response (physiological response) through breathing techniques and relaxation methods. You will work with a therapist while machines are used to monitor your physical symptoms. Stress management techniques. These include yoga, meditation, and exercise. A mental health specialist can help determine which treatment is best for you. Some  people see improvement with one type of therapy. However, other people require a combination   of therapies. Follow these instructions at home: Lifestyle Maintain a consistent routine and schedule. Anticipate stressful situations. Create a plan, and allow extra time to work with your plan. Practice stress management or self-calming techniques that you have learned from your therapist or your health care provider. General instructions Take over-the-counter and prescription medicines only as told by your health care provider. Understand that you are likely to have setbacks. Accept this and be kind to yourself as you persist to take better care of yourself. Recognize and accept your accomplishments, even if you judge them as small. Keep all follow-up visits as told by your health care provider. This is important. Contact a health care provider if: Your symptoms do not get better. Your symptoms get worse. You have signs of depression, such as: A persistently sad or irritable mood. Loss of enjoyment in activities that used to bring you joy. Change in weight or eating. Changes in sleeping habits. Avoiding friends or family members. Loss of energy for normal tasks. Feelings of guilt or worthlessness. Get help right away if: You have serious thoughts about hurting yourself or others. If you ever feel like you may hurt yourself or others, or have thoughts about taking your own life, get help right away. Go to your nearest emergency department or: Call your local emergency services (911 in the [place] Call a suicide crisis helpline, such as the National Suicide Prevention Lifeline at 1-800-273-8255. This is open 24 hours a day in the [place] Text the Crisis Text Line at 741741 (in the [place] Summary Generalized anxiety disorder (GAD) is a mental health condition that involves worry that is not triggered by a specific event. People with GAD often worry excessively about many things in their lives, such  as their health and family. GAD may cause symptoms such as restlessness, trouble concentrating, sleep problems, frequent sweating, nausea, diarrhea, headaches, and trembling or muscle twitching. A mental health specialist can help determine which treatment is best for you. Some people see improvement with one type of therapy. However, other people require a combination of therapies. This information is not intended to replace advice given to you by your health care provider. Make sure you discuss any questions you have with your health care provider. Document Revised: 04/26/2019 Document Reviewed: 04/26/2019 Elsevier Patient Education  2022 Elsevier Inc.  

## 2021-05-04 LAB — DRUG MONITOR, PANEL 1, W/CONF, URINE
Alphahydroxyalprazolam: 56 ng/mL — ABNORMAL HIGH (ref <25)
Alphahydroxymidazolam: NEGATIVE ng/mL (ref <50)
Alphahydroxytriazolam: NEGATIVE ng/mL (ref <50)
Aminoclonazepam: NEGATIVE ng/mL (ref <25)
Amphetamines: NEGATIVE ng/mL (ref <500)
Barbiturates: NEGATIVE ng/mL (ref <300)
Benzodiazepines: POSITIVE ng/mL — AB (ref <100)
Cocaine Metabolite: NEGATIVE ng/mL (ref <150)
Creatinine: 91.4 mg/dL (ref >=20.0)
Hydroxyethylflurazepam: NEGATIVE ng/mL (ref <50)
Lorazepam: NEGATIVE ng/mL (ref <50)
Marijuana Metabolite: NEGATIVE ng/mL (ref <20)
Methadone Metabolite: NEGATIVE ng/mL (ref <100)
Nordiazepam: NEGATIVE ng/mL (ref <50)
Opiates: NEGATIVE ng/mL (ref <100)
Oxazepam: NEGATIVE ng/mL (ref <50)
Oxidant: NEGATIVE ug/mL (ref <200)
Oxycodone: NEGATIVE ng/mL (ref <100)
Phencyclidine: NEGATIVE ng/mL (ref <25)
Temazepam: NEGATIVE ng/mL (ref <50)
pH: 8 (ref 4.5–9.0)

## 2021-05-04 LAB — LIPID PANEL
Cholesterol: 221 mg/dL — ABNORMAL HIGH (ref <200)
HDL: 60 mg/dL (ref >=50)
LDL Cholesterol (Calc): 141 mg/dL (calc) — ABNORMAL HIGH
Non-HDL Cholesterol (Calc): 161 mg/dL (calc) — ABNORMAL HIGH (ref <130)
Total CHOL/HDL Ratio: 3.7 (calc) (ref <5.0)
Triglycerides: 95 mg/dL (ref <150)

## 2021-05-04 LAB — CBC WITH DIFFERENTIAL/PLATELET
Absolute Monocytes: 896 cells/uL (ref 200–950)
Basophils Absolute: 45 cells/uL (ref 0–200)
Basophils Relative: 0.4 %
Eosinophils Absolute: 101 cells/uL (ref 15–500)
Eosinophils Relative: 0.9 %
HCT: 45.8 % — ABNORMAL HIGH (ref 35.0–45.0)
Hemoglobin: 15.4 g/dL (ref 11.7–15.5)
Lymphs Abs: 2722 cells/uL (ref 850–3900)
MCH: 30.2 pg (ref 27.0–33.0)
MCHC: 33.6 g/dL (ref 32.0–36.0)
MCV: 89.8 fL (ref 80.0–100.0)
MPV: 9.9 fL (ref 7.5–12.5)
Monocytes Relative: 8 %
Neutro Abs: 7437 cells/uL (ref 1500–7800)
Neutrophils Relative %: 66.4 %
Platelets: 316 10*3/uL (ref 140–400)
RBC: 5.1 10*6/uL (ref 3.80–5.10)
RDW: 12.3 % (ref 11.0–15.0)
Total Lymphocyte: 24.3 %
WBC: 11.2 10*3/uL — ABNORMAL HIGH (ref 3.8–10.8)

## 2021-05-04 LAB — COMPLETE METABOLIC PANEL WITH GFR
AG Ratio: 1.6 (calc) (ref 1.0–2.5)
ALT: 19 U/L (ref 6–29)
AST: 14 U/L (ref 10–35)
Albumin: 4.5 g/dL (ref 3.6–5.1)
Alkaline phosphatase (APISO): 74 U/L (ref 37–153)
BUN: 17 mg/dL (ref 7–25)
CO2: 31 mmol/L (ref 20–32)
Calcium: 9.8 mg/dL (ref 8.6–10.4)
Chloride: 105 mmol/L (ref 98–110)
Creat: 1 mg/dL (ref 0.60–1.00)
Globulin: 2.9 g/dL (calc) (ref 1.9–3.7)
Glucose, Bld: 110 mg/dL (ref 65–139)
Potassium: 4.1 mmol/L (ref 3.5–5.3)
Sodium: 144 mmol/L (ref 135–146)
Total Bilirubin: 0.3 mg/dL (ref 0.2–1.2)
Total Protein: 7.4 g/dL (ref 6.1–8.1)
eGFR: 58 mL/min/{1.73_m2} — ABNORMAL LOW (ref >=60)

## 2021-05-04 LAB — HEMOGLOBIN A1C
Hgb A1c MFr Bld: 6.9 % of total Hgb — ABNORMAL HIGH (ref <5.7)
Mean Plasma Glucose: 151 mg/dL
eAG (mmol/L): 8.4 mmol/L

## 2021-05-04 LAB — DM TEMPLATE

## 2021-05-09 NOTE — Progress Notes (Signed)
Urine drug screen showed that she is taking her Xanax. WBC is slightly elevated at 11.2 with normal values 3.8 - 10.8, will need to repeat in 2 weeks -  hemoglobin is 15.4, normal and not anemic -  electrolytes within normal levels but remains to have chronic kidney disease stage IIIa which is same as before -  cholesterol is elevated at 221 but this lab was taken without fasting so not an accurate level and will need to continue taking her Zetia. Needs to be repeated in 3 months and needs to fast X 12 hours. -  hgbA1c is 6.9, this value is not accurate and will need to be repeated in 3 months and needs to fast X 12 hours

## 2021-05-12 ENCOUNTER — Other Ambulatory Visit: Payer: Self-pay

## 2021-05-12 ENCOUNTER — Ambulatory Visit (INDEPENDENT_AMBULATORY_CARE_PROVIDER_SITE_OTHER): Payer: Medicare Other | Admitting: Nurse Practitioner

## 2021-05-12 ENCOUNTER — Encounter: Payer: Self-pay | Admitting: Nurse Practitioner

## 2021-05-12 DIAGNOSIS — G4700 Insomnia, unspecified: Secondary | ICD-10-CM | POA: Diagnosis not present

## 2021-05-12 DIAGNOSIS — N1831 Chronic kidney disease, stage 3a: Secondary | ICD-10-CM | POA: Diagnosis not present

## 2021-05-12 DIAGNOSIS — F419 Anxiety disorder, unspecified: Secondary | ICD-10-CM | POA: Diagnosis not present

## 2021-05-12 NOTE — Progress Notes (Signed)
   This service is provided via telemedicine  No vital signs collected/recorded due to the encounter was a telemedicine visit.   Location of patient (ex: home, work):  Home  Patient consents to a telephone visit: Yes, see telephone visit dated 01/09/2021  Location of the provider (ex: office, home):  [facility], Office   Name of any referring provider:  N/A  Names of all persons participating in the telemedicine service and their role in the encounter:  S.Chrae B/CMA, Sherrie Mustache, NP, and Patient   Time spent on call:  5 min with medical assistant
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
## 2021-06-06 ENCOUNTER — Other Ambulatory Visit: Payer: Self-pay | Admitting: Nurse Practitioner

## 2021-06-06 DIAGNOSIS — E782 Mixed hyperlipidemia: Secondary | ICD-10-CM

## 2021-06-06 DIAGNOSIS — I10 Essential (primary) hypertension: Secondary | ICD-10-CM

## 2021-06-09 ENCOUNTER — Other Ambulatory Visit: Payer: Self-pay | Admitting: *Deleted

## 2021-06-09 DIAGNOSIS — F419 Anxiety disorder, unspecified: Secondary | ICD-10-CM

## 2021-06-09 MED ORDER — ALPRAZOLAM 0.5 MG PO TABS: ORAL_TABLET | ORAL | 5 refills | Status: DC

## 2021-06-09 NOTE — Telephone Encounter (Signed)
Patient requested refill.  Epic LR: 04/30/2021 Contract on [facility] and sent to [facility] for approval.

## 2021-06-26 ENCOUNTER — Other Ambulatory Visit: Payer: Self-pay

## 2021-06-26 ENCOUNTER — Ambulatory Visit (INDEPENDENT_AMBULATORY_CARE_PROVIDER_SITE_OTHER): Payer: Medicare Other | Admitting: Nurse Practitioner

## 2021-06-26 DIAGNOSIS — J0140 Acute pansinusitis, unspecified: Secondary | ICD-10-CM | POA: Diagnosis not present

## 2021-06-26 MED ORDER — AMOXICILLIN-POT CLAVULANATE 875-125 MG PO TABS: 1.0000 | ORAL_TABLET | Freq: Two times a day (BID) | ORAL | 0 refills | Status: DC
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
## 2021-06-26 NOTE — Progress Notes (Signed)
This service is provided via telemedicine  No vital signs collected/recorded due to the encounter was a telemedicine visit.   Location of patient (ex: home, work):  Home  Patient consents to a telephone visit:  Yes, see encounter dated 01/09/2021  Location of the provider (ex: office, home):  [place]  Name of any referring provider:  N/A  Names of all persons participating in the telemedicine service and their role in the encounter:  Sherrie Mustache, Nurse Practitioner, Carroll Kinds, CMA, and patient.   Time spent on call:  11 minutes with medical assistant

## 2021-08-22 ENCOUNTER — Telehealth: Payer: Self-pay

## 2021-08-22 NOTE — Telephone Encounter (Signed)
Patient called concerning a lab bill she received from [facility] for $99.25. I reached out to [facility] billing department and they stated the dx code used for not approved via patients insurance company. While on the phone with the billing department I reach out to the provider at [facility] to resubmit additional code and she gave F13.20. Called patient and advised her that it will take 30-45 days after resubmitting to see it change in bill per [facility] billing department.

## 2021-11-14 ENCOUNTER — Ambulatory Visit: Payer: Medicare Other | Admitting: Nurse Practitioner

## 2021-12-08 ENCOUNTER — Other Ambulatory Visit: Payer: Self-pay | Admitting: Nurse Practitioner

## 2021-12-08 DIAGNOSIS — F419 Anxiety disorder, unspecified: Secondary | ICD-10-CM

## 2021-12-09 NOTE — Telephone Encounter (Signed)
Pharmacy requested refill.  Epic LR: 06/09/2021 Contract Date: 04/30/2021 Pended Rx and sent to [facility] for approval.

## 2021-12-26 ENCOUNTER — Encounter: Payer: Self-pay | Admitting: Nurse Practitioner

## 2021-12-26 ENCOUNTER — Ambulatory Visit (INDEPENDENT_AMBULATORY_CARE_PROVIDER_SITE_OTHER): Payer: Medicare Other | Admitting: Nurse Practitioner

## 2021-12-26 VITALS — BP 130/80 | HR 99 | Temp 97.5°F | Ht 64.0 in | Wt 182.0 lb

## 2021-12-26 DIAGNOSIS — E782 Mixed hyperlipidemia: Secondary | ICD-10-CM

## 2021-12-26 DIAGNOSIS — E661 Drug-induced obesity: Secondary | ICD-10-CM

## 2021-12-26 DIAGNOSIS — N1831 Chronic kidney disease, stage 3a: Secondary | ICD-10-CM

## 2021-12-26 DIAGNOSIS — F419 Anxiety disorder, unspecified: Secondary | ICD-10-CM

## 2021-12-26 DIAGNOSIS — Z6831 Body mass index (BMI) 31.0-31.9, adult: Secondary | ICD-10-CM | POA: Diagnosis not present

## 2021-12-26 DIAGNOSIS — I10 Essential (primary) hypertension: Secondary | ICD-10-CM | POA: Diagnosis not present

## 2021-12-26 DIAGNOSIS — Z1159 Encounter for screening for other viral diseases: Secondary | ICD-10-CM

## 2021-12-26 DIAGNOSIS — E1122 Type 2 diabetes mellitus with diabetic chronic kidney disease: Secondary | ICD-10-CM | POA: Diagnosis not present

## 2021-12-26 NOTE — Patient Instructions (Signed)
Call and make appt with ophthalmology for diabetic eye exam (Dr Gershon Crane)
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
## 2021-12-29 LAB — HEMOGLOBIN A1C
Hgb A1c MFr Bld: 6.8 % of total Hgb — ABNORMAL HIGH (ref <5.7)
Mean Plasma Glucose: 148 mg/dL
eAG (mmol/L): 8.2 mmol/L

## 2021-12-29 LAB — COMPLETE METABOLIC PANEL WITH GFR
AG Ratio: 1.7 (calc) (ref 1.0–2.5)
ALT: 28 U/L (ref 6–29)
AST: 18 U/L (ref 10–35)
Albumin: 4.3 g/dL (ref 3.6–5.1)
Alkaline phosphatase (APISO): 71 U/L (ref 37–153)
BUN: 15 mg/dL (ref 7–25)
CO2: 28 mmol/L (ref 20–32)
Calcium: 9.6 mg/dL (ref 8.6–10.4)
Chloride: 108 mmol/L (ref 98–110)
Creat: 0.92 mg/dL (ref 0.60–1.00)
Globulin: 2.6 g/dL (calc) (ref 1.9–3.7)
Glucose, Bld: 113 mg/dL (ref 65–139)
Potassium: 4.6 mmol/L (ref 3.5–5.3)
Sodium: 143 mmol/L (ref 135–146)
Total Bilirubin: 0.3 mg/dL (ref 0.2–1.2)
Total Protein: 6.9 g/dL (ref 6.1–8.1)
eGFR: 64 mL/min/{1.73_m2} (ref >=60)

## 2021-12-29 LAB — LIPID PANEL
Cholesterol: 196 mg/dL (ref <200)
HDL: 56 mg/dL (ref >=50)
LDL Cholesterol (Calc): 119 mg/dL (calc) — ABNORMAL HIGH
Non-HDL Cholesterol (Calc): 140 mg/dL (calc) — ABNORMAL HIGH (ref <130)
Total CHOL/HDL Ratio: 3.5 (calc) (ref <5.0)
Triglycerides: 99 mg/dL (ref <150)

## 2021-12-29 LAB — CBC WITH DIFFERENTIAL/PLATELET
Absolute Monocytes: 882 cells/uL (ref 200–950)
Basophils Absolute: 59 cells/uL (ref 0–200)
Basophils Relative: 0.6 %
Eosinophils Absolute: 206 cells/uL (ref 15–500)
Eosinophils Relative: 2.1 %
HCT: 46.9 % — ABNORMAL HIGH (ref 35.0–45.0)
Hemoglobin: 15.3 g/dL (ref 11.7–15.5)
Lymphs Abs: 2783 cells/uL (ref 850–3900)
MCH: 29.4 pg (ref 27.0–33.0)
MCHC: 32.6 g/dL (ref 32.0–36.0)
MCV: 90 fL (ref 80.0–100.0)
MPV: 10.1 fL (ref 7.5–12.5)
Monocytes Relative: 9 %
Neutro Abs: 5870 cells/uL (ref 1500–7800)
Neutrophils Relative %: 59.9 %
Platelets: 291 10*3/uL (ref 140–400)
RBC: 5.21 10*6/uL — ABNORMAL HIGH (ref 3.80–5.10)
RDW: 12.8 % (ref 11.0–15.0)
Total Lymphocyte: 28.4 %
WBC: 9.8 10*3/uL (ref 3.8–10.8)

## 2021-12-29 LAB — HEPATITIS C ANTIBODY
Hepatitis C Ab: NONREACTIVE
SIGNAL TO CUT-OFF: 0.16 (ref <1.00)

## 2022-01-08 ENCOUNTER — Encounter: Payer: Medicare Other | Admitting: Nurse Practitioner

## 2022-01-09 ENCOUNTER — Encounter: Payer: Medicare Other | Admitting: Nurse Practitioner

## 2022-01-12 ENCOUNTER — Encounter: Payer: Medicare Other | Admitting: Nurse Practitioner

## 2022-01-13 ENCOUNTER — Telehealth: Payer: Self-pay

## 2022-01-13 ENCOUNTER — Encounter: Payer: Self-pay | Admitting: Nurse Practitioner

## 2022-01-13 ENCOUNTER — Ambulatory Visit (INDEPENDENT_AMBULATORY_CARE_PROVIDER_SITE_OTHER): Payer: Medicare Other | Admitting: Nurse Practitioner

## 2022-01-13 DIAGNOSIS — Z0000 Encounter for general adult medical examination without abnormal findings: Secondary | ICD-10-CM | POA: Diagnosis not present
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
## 2022-06-15 ENCOUNTER — Other Ambulatory Visit: Payer: Self-pay

## 2022-06-15 DIAGNOSIS — F419 Anxiety disorder, unspecified: Secondary | ICD-10-CM

## 2022-06-15 NOTE — Telephone Encounter (Signed)
Patient is requesting a refill of the following medications: Requested Prescriptions   Pending Prescriptions Disp Refills   ALPRAZolam (XANAX) 0.5 MG tablet 30 tablet 5    Sig: TAKE ONE HALF TO ONE TABLET BY MOUTH AT BEDTIME AS NEEDED FOR ANXIETY.    Date of last refill: 12/09/2021  Refill amount: 30/5 refills   Treatment agreement date: 04/30/21, notation made on pending appointment for 06/23/2022

## 2022-06-17 ENCOUNTER — Telehealth: Payer: Medicare Other

## 2022-06-17 MED ORDER — ALPRAZOLAM 0.5 MG PO TABS: ORAL_TABLET | ORAL | 5 refills | Status: DC

## 2022-06-17 NOTE — Telephone Encounter (Signed)
Pharmacy send another rx for Xanax 0.5 mg and patient currently have a pending rx request.

## 2022-06-17 NOTE — Telephone Encounter (Signed)
Rx sent 

## 2022-07-03 ENCOUNTER — Ambulatory Visit: Payer: Medicare Other | Admitting: Nurse Practitioner

## 2022-08-01 ENCOUNTER — Other Ambulatory Visit: Payer: Self-pay | Admitting: Nurse Practitioner

## 2022-08-01 DIAGNOSIS — I10 Essential (primary) hypertension: Secondary | ICD-10-CM

## 2022-08-01 DIAGNOSIS — E782 Mixed hyperlipidemia: Secondary | ICD-10-CM

## 2022-12-22 ENCOUNTER — Other Ambulatory Visit: Payer: Self-pay

## 2022-12-22 DIAGNOSIS — F419 Anxiety disorder, unspecified: Secondary | ICD-10-CM

## 2022-12-22 MED ORDER — ALPRAZOLAM 0.5 MG PO TABS: ORAL_TABLET | ORAL | 5 refills | Status: DC

## 2022-12-22 NOTE — Telephone Encounter (Signed)
Patient called requesting refill on Alprazolam. Prescription last written 06/17/2022. No up to date contract on file.   Medication has been pended and sent to Abbey Chatters, NP.

## 2022-12-23 ENCOUNTER — Telehealth: Payer: Self-pay

## 2022-12-23 NOTE — Telephone Encounter (Signed)
Patient call this afternoon to ask for an update on medication Alprazolam  that was sent to the pharmacy because patient was worry to due to going out of town to a funeral this weekend and have not heard from the pharmacy. I called the pharmacy and the patient to let her know that the [facility] will refill the medication tomorrow.

## 2023-01-11 ENCOUNTER — Encounter: Payer: Self-pay | Admitting: Nurse Practitioner

## 2023-01-11 ENCOUNTER — Encounter: Payer: Medicare Other | Admitting: Nurse Practitioner

## 2023-01-11 NOTE — Progress Notes (Signed)
err

## 2023-01-18 ENCOUNTER — Encounter: Payer: Medicare Other | Admitting: Nurse Practitioner

## 2023-01-18 ENCOUNTER — Encounter: Payer: Self-pay | Admitting: Nurse Practitioner

## 2023-01-18 NOTE — Progress Notes (Signed)
Err

## 2023-02-18 ENCOUNTER — Encounter: Payer: Self-pay | Admitting: Nurse Practitioner

## 2023-02-19 ENCOUNTER — Encounter: Payer: Self-pay | Admitting: Nurse Practitioner

## 2023-02-19 ENCOUNTER — Ambulatory Visit (INDEPENDENT_AMBULATORY_CARE_PROVIDER_SITE_OTHER): Payer: Medicare Other | Admitting: Nurse Practitioner

## 2023-02-19 VITALS — BP 136/70 | HR 88 | Temp 97.5°F | Ht 64.0 in | Wt 181.0 lb

## 2023-02-19 DIAGNOSIS — E782 Mixed hyperlipidemia: Secondary | ICD-10-CM

## 2023-02-19 DIAGNOSIS — F419 Anxiety disorder, unspecified: Secondary | ICD-10-CM

## 2023-02-19 DIAGNOSIS — E1122 Type 2 diabetes mellitus with diabetic chronic kidney disease: Secondary | ICD-10-CM | POA: Diagnosis not present

## 2023-02-19 DIAGNOSIS — N1831 Chronic kidney disease, stage 3a: Secondary | ICD-10-CM

## 2023-02-19 DIAGNOSIS — I10 Essential (primary) hypertension: Secondary | ICD-10-CM | POA: Diagnosis not present

## 2023-02-19 LAB — CBC WITH DIFFERENTIAL/PLATELET
Absolute Monocytes: 744 cells/uL (ref 200–950)
Basophils Absolute: 44 cells/uL (ref 0–200)
Basophils Relative: 0.4 %
Eosinophils Absolute: 89 cells/uL (ref 15–500)
Eosinophils Relative: 0.8 %
HCT: 47.4 % — ABNORMAL HIGH (ref 35.0–45.0)
Hemoglobin: 15.8 g/dL — ABNORMAL HIGH (ref 11.7–15.5)
Lymphs Abs: 2819 cells/uL (ref 850–3900)
MCH: 29.9 pg (ref 27.0–33.0)
MCHC: 33.3 g/dL (ref 32.0–36.0)
MCV: 89.8 fL (ref 80.0–100.0)
MPV: 10.9 fL (ref 7.5–12.5)
Monocytes Relative: 6.7 %
Neutro Abs: 7404 cells/uL (ref 1500–7800)
Neutrophils Relative %: 66.7 %
Platelets: 287 10*3/uL (ref 140–400)
RBC: 5.28 10*6/uL — ABNORMAL HIGH (ref 3.80–5.10)
RDW: 12.1 % (ref 11.0–15.0)
Total Lymphocyte: 25.4 %
WBC: 11.1 10*3/uL — ABNORMAL HIGH (ref 3.8–10.8)
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
## 2023-02-26 ENCOUNTER — Ambulatory Visit: Payer: Medicare Other | Admitting: Nurse Practitioner

## 2023-02-26 ENCOUNTER — Encounter: Payer: Self-pay | Admitting: Nurse Practitioner

## 2023-02-26 VITALS — BP 138/84 | HR 75 | Temp 97.1°F | Ht 64.0 in | Wt 180.0 lb

## 2023-02-26 DIAGNOSIS — E1122 Type 2 diabetes mellitus with diabetic chronic kidney disease: Secondary | ICD-10-CM

## 2023-02-26 DIAGNOSIS — E782 Mixed hyperlipidemia: Secondary | ICD-10-CM

## 2023-02-26 DIAGNOSIS — N1831 Chronic kidney disease, stage 3a: Secondary | ICD-10-CM

## 2023-02-26 MED ORDER — METFORMIN HCL 1000 MG PO TABS: ORAL_TABLET | ORAL | 3 refills | Status: DC
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
## 2023-02-26 NOTE — Patient Instructions (Signed)
For METFORMIN  Half tablet with breakfast for 1 week then increase to half tablet with breakfast and supper for 1 week then increase to whole tablet for breakfast and half tablet for supper for 1 week then increase to whole tablet for breakfast and supper and continue   To walk or move your body 10 mins after each meal

## 2023-02-27 ENCOUNTER — Other Ambulatory Visit: Payer: Self-pay | Admitting: Nurse Practitioner

## 2023-02-27 DIAGNOSIS — N1831 Chronic kidney disease, stage 3a: Secondary | ICD-10-CM

## 2023-03-08 ENCOUNTER — Encounter (HOSPITAL_COMMUNITY): Payer: Self-pay | Admitting: *Deleted

## 2023-03-08 ENCOUNTER — Ambulatory Visit (HOSPITAL_COMMUNITY)
Admission: EM | Admit: 2023-03-08 | Discharge: 2023-03-08 | Disposition: A | Discharge status: Home / Self Care | Payer: Medicare Other

## 2023-03-08 ENCOUNTER — Other Ambulatory Visit: Payer: Self-pay

## 2023-03-08 DIAGNOSIS — H5789 Other specified disorders of eye and adnexa: Secondary | ICD-10-CM | POA: Diagnosis not present
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
## 2023-03-08 NOTE — Discharge Instructions (Addendum)
Start using Blink eye drops to help with eye irritation.  Follow up with Optometrist or Ophthalmologist if symptoms do not improve or worsen with treatment.

## 2023-03-08 NOTE — ED Triage Notes (Signed)
PT reports the Lt eye has been red and hurts. Pt wants her eyes checked because her A1C is elevated. Pt wants to know if the A1C is effecting her eye. Pt's eye DR just retired and Pt was not given a referral.

## 2023-03-29 ENCOUNTER — Telehealth: Payer: Medicare Other | Admitting: *Deleted

## 2023-03-29 NOTE — Telephone Encounter (Signed)
Patient called and left message on clinical intake stating that she wants to discuss Weight Loss.   I tried calling patient back twice but each time it rings busy. Will try again later.

## 2023-03-30 NOTE — Telephone Encounter (Signed)
Spoke with patient. Scheduled appointment with Dr.Veludandi tomorrow at 3:00 pm (patient states she need an afternoon appointment)

## 2023-03-30 NOTE — Telephone Encounter (Signed)
Patient called stating she has lost 40 pounds in 2 weeks and questions if this is normal. Patient states her bowels are loose, however not diarrhea. Patient has changed back to her original diet "Low cards" and increased her exercise.   Patient refused video visit stating " I'm not tech savvy"

## 2023-03-30 NOTE — Telephone Encounter (Signed)
See if she can come to office tomorrow for in person visit

## 2023-03-30 NOTE — Telephone Encounter (Signed)
Needs to have an appt to discuss and evaluation

## 2023-03-30 NOTE — Telephone Encounter (Signed)
If the patient is not able to come in, what would be the next course of action?

## 2023-03-31 ENCOUNTER — Encounter: Payer: Self-pay | Admitting: Nurse Practitioner

## 2023-03-31 ENCOUNTER — Ambulatory Visit (INDEPENDENT_AMBULATORY_CARE_PROVIDER_SITE_OTHER): Payer: Medicare Other | Admitting: Nurse Practitioner

## 2023-03-31 VITALS — BP 138/72 | HR 89 | Temp 97.3°F | Resp 18 | Ht 64.0 in | Wt 179.0 lb

## 2023-03-31 DIAGNOSIS — E1122 Type 2 diabetes mellitus with diabetic chronic kidney disease: Secondary | ICD-10-CM

## 2023-03-31 DIAGNOSIS — N1831 Chronic kidney disease, stage 3a: Secondary | ICD-10-CM

## 2023-03-31 MED ORDER — METFORMIN HCL 1000 MG PO TABS
1000.0000 mg | ORAL_TABLET | Freq: Two times a day (BID) | ORAL | Status: DC
Start: 2023-03-31 — End: 2023-03-31

## 2023-03-31 MED ORDER — METFORMIN HCL 1000 MG PO TABS
1000.0000 mg | ORAL_TABLET | Freq: Two times a day (BID) | ORAL | Status: DC
Start: 2023-03-31 — End: 2024-02-10

## 2023-03-31 NOTE — Patient Instructions (Signed)
Increase fiber- can use benefiber daily
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
## 2023-04-01 LAB — MICROALBUMIN / CREATININE URINE RATIO
Creatinine, Urine: 120 mg/dL (ref 20–275)
Microalb Creat Ratio: 13 mg/g{creat} (ref <30)
Microalb, Ur: 1.6 mg/dL

## 2023-05-27 DIAGNOSIS — T1502XA Foreign body in cornea, left eye, initial encounter: Secondary | ICD-10-CM | POA: Diagnosis not present

## 2023-05-31 DIAGNOSIS — T1502XA Foreign body in cornea, left eye, initial encounter: Secondary | ICD-10-CM | POA: Diagnosis not present

## 2023-05-31 DIAGNOSIS — H16002 Unspecified corneal ulcer, left eye: Secondary | ICD-10-CM | POA: Diagnosis not present

## 2023-06-01 ENCOUNTER — Other Ambulatory Visit: Payer: Medicare Other

## 2023-06-02 ENCOUNTER — Ambulatory Visit (INDEPENDENT_AMBULATORY_CARE_PROVIDER_SITE_OTHER): Payer: Medicare Other | Admitting: Nurse Practitioner

## 2023-06-02 ENCOUNTER — Encounter: Payer: Self-pay | Admitting: Nurse Practitioner

## 2023-06-02 VITALS — BP 132/78 | HR 96 | Temp 97.8°F | Resp 18 | Ht 64.0 in | Wt 173.8 lb

## 2023-06-02 DIAGNOSIS — N1831 Chronic kidney disease, stage 3a: Secondary | ICD-10-CM | POA: Diagnosis not present

## 2023-06-02 DIAGNOSIS — E782 Mixed hyperlipidemia: Secondary | ICD-10-CM

## 2023-06-02 DIAGNOSIS — E1122 Type 2 diabetes mellitus with diabetic chronic kidney disease: Secondary | ICD-10-CM | POA: Diagnosis not present

## 2023-06-02 DIAGNOSIS — I10 Essential (primary) hypertension: Secondary | ICD-10-CM

## 2023-06-02 DIAGNOSIS — E663 Overweight: Secondary | ICD-10-CM | POA: Diagnosis not present
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
## 2023-06-03 ENCOUNTER — Telehealth: Payer: Self-pay

## 2023-06-03 LAB — COMPLETE METABOLIC PANEL WITH GFR
AG Ratio: 1.5 (calc) (ref 1.0–2.5)
ALT: 21 U/L (ref 6–29)
AST: 14 U/L (ref 10–35)
Albumin: 4.3 g/dL (ref 3.6–5.1)
Alkaline phosphatase (APISO): 70 U/L (ref 37–153)
BUN: 22 mg/dL (ref 7–25)
CO2: 30 mmol/L (ref 20–32)
Calcium: 9.6 mg/dL (ref 8.6–10.4)
Chloride: 105 mmol/L (ref 98–110)
Creat: 0.91 mg/dL (ref 0.60–0.95)
Globulin: 2.8 g/dL (ref 1.9–3.7)
Glucose, Bld: 136 mg/dL — ABNORMAL HIGH (ref 65–99)
Potassium: 4.1 mmol/L (ref 3.5–5.3)
Sodium: 144 mmol/L (ref 135–146)
Total Bilirubin: 0.3 mg/dL (ref 0.2–1.2)
Total Protein: 7.1 g/dL (ref 6.1–8.1)
eGFR: 64 mL/min/{1.73_m2} (ref >=60)

## 2023-06-03 LAB — LIPID PANEL
Cholesterol: 244 mg/dL — ABNORMAL HIGH (ref <200)
HDL: 46 mg/dL — ABNORMAL LOW (ref >=50)
LDL Cholesterol (Calc): 163 mg/dL — ABNORMAL HIGH
Non-HDL Cholesterol (Calc): 198 mg/dL — ABNORMAL HIGH (ref <130)
Total CHOL/HDL Ratio: 5.3 (calc) — ABNORMAL HIGH (ref <5.0)
Triglycerides: 192 mg/dL — ABNORMAL HIGH (ref <150)

## 2023-06-03 LAB — HEMOGLOBIN A1C
Hgb A1c MFr Bld: 7.6 %{Hb} — ABNORMAL HIGH (ref <5.7)
Mean Plasma Glucose: 171 mg/dL
eAG (mmol/L): 9.5 mmol/L

## 2023-06-04 ENCOUNTER — Ambulatory Visit: Payer: Medicare Other | Admitting: Nurse Practitioner

## 2023-06-08 DIAGNOSIS — H16002 Unspecified corneal ulcer, left eye: Secondary | ICD-10-CM | POA: Diagnosis not present

## 2023-06-11 ENCOUNTER — Other Ambulatory Visit: Payer: Self-pay

## 2023-06-11 DIAGNOSIS — F419 Anxiety disorder, unspecified: Secondary | ICD-10-CM

## 2023-06-11 MED ORDER — ALPRAZOLAM 0.5 MG PO TABS
ORAL_TABLET | ORAL | 5 refills | Status: DC
Start: 2023-06-11 — End: 2023-12-08

## 2023-06-11 NOTE — Telephone Encounter (Signed)
Patient called and reported she had only one refilled left on her  Xanax 0.5 mg and the pharmacy wouldn't refill the medication for her. Spoke with the pharmacist at [facility] and they stated medication was refilled today,06/11/23 for the patient and they explained to the pt she needed to contact her PCP for additional refill after today's refill.

## 2023-07-15 NOTE — Telephone Encounter (Signed)
Error

## 2023-09-03 ENCOUNTER — Encounter: Payer: Medicare Other | Admitting: Nurse Practitioner

## 2023-09-03 NOTE — Progress Notes (Signed)
This encounter was created in error - please disregard.
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
## 2023-09-03 NOTE — Patient Instructions (Signed)
err

## 2023-09-08 ENCOUNTER — Other Ambulatory Visit: Payer: Self-pay | Admitting: Nurse Practitioner

## 2023-09-08 DIAGNOSIS — E782 Mixed hyperlipidemia: Secondary | ICD-10-CM

## 2023-09-08 DIAGNOSIS — I10 Essential (primary) hypertension: Secondary | ICD-10-CM

## 2023-12-08 ENCOUNTER — Other Ambulatory Visit: Payer: Self-pay

## 2023-12-08 DIAGNOSIS — F419 Anxiety disorder, unspecified: Secondary | ICD-10-CM

## 2023-12-08 MED ORDER — ALPRAZOLAM 0.5 MG PO TABS: ORAL_TABLET | ORAL | 5 refills | Status: DC

## 2023-12-08 NOTE — Progress Notes (Signed)
 Patient has request refill on Xanax .  Contract signed 02-18-2023, and last refilled 06-11-23. Patient medication pend and sent to PCP Roselie Conger Jessica K, NP

## 2023-12-08 NOTE — Progress Notes (Signed)
 Refill sent.

## 2023-12-10 NOTE — Telephone Encounter (Signed)
 This encounter was created in error - please disregard.

## 2023-12-17 ENCOUNTER — Encounter: Payer: Medicare Other | Admitting: Nurse Practitioner

## 2023-12-17 NOTE — Progress Notes (Signed)
 This encounter was created in error - please disregard.

## 2024-02-10 ENCOUNTER — Other Ambulatory Visit: Payer: Self-pay | Admitting: Nurse Practitioner

## 2024-02-10 DIAGNOSIS — E1122 Type 2 diabetes mellitus with diabetic chronic kidney disease: Secondary | ICD-10-CM

## 2024-02-28 ENCOUNTER — Encounter: Payer: Self-pay | Admitting: Nurse Practitioner

## 2024-02-28 ENCOUNTER — Ambulatory Visit (INDEPENDENT_AMBULATORY_CARE_PROVIDER_SITE_OTHER): Admitting: Nurse Practitioner

## 2024-02-28 VITALS — BP 138/76 | HR 89 | Temp 97.7°F | Resp 14 | Ht 64.0 in | Wt 178.0 lb

## 2024-02-28 DIAGNOSIS — E782 Mixed hyperlipidemia: Secondary | ICD-10-CM | POA: Diagnosis not present

## 2024-02-28 DIAGNOSIS — F419 Anxiety disorder, unspecified: Secondary | ICD-10-CM | POA: Diagnosis not present

## 2024-02-28 DIAGNOSIS — E1122 Type 2 diabetes mellitus with diabetic chronic kidney disease: Secondary | ICD-10-CM | POA: Diagnosis not present

## 2024-02-28 DIAGNOSIS — N1831 Chronic kidney disease, stage 3a: Secondary | ICD-10-CM | POA: Insufficient documentation

## 2024-02-28 DIAGNOSIS — I10 Essential (primary) hypertension: Secondary | ICD-10-CM

## 2024-02-28 DIAGNOSIS — F17200 Nicotine dependence, unspecified, uncomplicated: Secondary | ICD-10-CM | POA: Diagnosis not present

## 2024-02-28 DIAGNOSIS — M150 Primary generalized (osteo)arthritis: Secondary | ICD-10-CM | POA: Diagnosis not present

## 2024-02-28 MED ORDER — SITAGLIPTIN PHOSPHATE 100 MG PO TABS: 100.0000 mg | ORAL_TABLET | Freq: Every day | ORAL | 1 refills | Status: DC

## 2024-02-28 NOTE — Assessment & Plan Note (Signed)
 Ongoing, side effects to most anti-anxiety medication Tolerates xanax  without adverse effects.

## 2024-02-28 NOTE — Assessment & Plan Note (Signed)
 Encouraged cessation.

## 2024-02-28 NOTE — Assessment & Plan Note (Signed)
 Stable no worsening of pain at this time.

## 2024-02-28 NOTE — Assessment & Plan Note (Addendum)
 Uncontrolled, not consistent with metformin  due to side effects.  Will start januvia  100 mg by mouth daily - discussed with the patient the pathophysiology of diabetes and the natural progression of the disease.  -stressed the importance of lifestyle changes including diet and exercise. -discussed complications associated with diabetes including retinopathy, nephropathy, neuropathy as well as increased risk of cardiovascular disease. We went over the benefit seen with glycemic control.   -Encouraged dietary compliance, routine foot care/monitoring and to keep up with diabetic eye exams through ophthalmology

## 2024-02-28 NOTE — Assessment & Plan Note (Signed)
 Blood pressure well controlled, goal bp <140/90 Continue current medications and dietary modifications follow metabolic panel

## 2024-02-28 NOTE — Assessment & Plan Note (Signed)
 stable Encourage proper hydration Follow metabolic panel Avoid nephrotoxic meds (NSAIDS)
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
## 2024-02-28 NOTE — Assessment & Plan Note (Signed)
 Continues on zetia , encouraged dietary modificatons

## 2024-02-29 ENCOUNTER — Ambulatory Visit: Payer: Self-pay | Admitting: Nurse Practitioner

## 2024-02-29 DIAGNOSIS — N1831 Chronic kidney disease, stage 3a: Secondary | ICD-10-CM

## 2024-02-29 LAB — CBC WITH DIFFERENTIAL/PLATELET
Absolute Lymphocytes: 2236 {cells}/uL (ref 850–3900)
Absolute Monocytes: 894 {cells}/uL (ref 200–950)
Basophils Absolute: 31 {cells}/uL (ref 0–200)
Basophils Relative: 0.3 %
Eosinophils Absolute: 125 {cells}/uL (ref 15–500)
Eosinophils Relative: 1.2 %
HCT: 48.4 % — ABNORMAL HIGH (ref 35.0–45.0)
Hemoglobin: 15.9 g/dL — ABNORMAL HIGH (ref 11.7–15.5)
MCH: 29.6 pg (ref 27.0–33.0)
MCHC: 32.9 g/dL (ref 32.0–36.0)
MCV: 90 fL (ref 80.0–100.0)
MPV: 10.2 fL (ref 7.5–12.5)
Monocytes Relative: 8.6 %
Neutro Abs: 7114 {cells}/uL (ref 1500–7800)
Neutrophils Relative %: 68.4 %
Platelets: 313 Thousand/uL (ref 140–400)
RBC: 5.38 Million/uL — ABNORMAL HIGH (ref 3.80–5.10)
RDW: 12.9 % (ref 11.0–15.0)
Total Lymphocyte: 21.5 %
WBC: 10.4 Thousand/uL (ref 3.8–10.8)

## 2024-02-29 LAB — COMPREHENSIVE METABOLIC PANEL WITH GFR
AG Ratio: 1.6 (calc) (ref 1.0–2.5)
ALT: 22 U/L (ref 6–29)
AST: 16 U/L (ref 10–35)
Albumin: 4.5 g/dL (ref 3.6–5.1)
Alkaline phosphatase (APISO): 72 U/L (ref 37–153)
BUN/Creatinine Ratio: 19 (calc) (ref 6–22)
BUN: 20 mg/dL (ref 7–25)
CO2: 29 mmol/L (ref 20–32)
Calcium: 9.9 mg/dL (ref 8.6–10.4)
Chloride: 105 mmol/L (ref 98–110)
Creat: 1.03 mg/dL — ABNORMAL HIGH (ref 0.60–0.95)
Globulin: 2.8 g/dL (ref 1.9–3.7)
Glucose, Bld: 113 mg/dL — ABNORMAL HIGH (ref 65–99)
Potassium: 4.3 mmol/L (ref 3.5–5.3)
Sodium: 143 mmol/L (ref 135–146)
Total Bilirubin: 0.3 mg/dL (ref 0.2–1.2)
Total Protein: 7.3 g/dL (ref 6.1–8.1)
eGFR: 55 mL/min/1.73m2 — ABNORMAL LOW (ref >=60)

## 2024-02-29 LAB — LIPID PANEL
Cholesterol: 230 mg/dL — ABNORMAL HIGH (ref <200)
HDL: 55 mg/dL (ref >=50)
LDL Cholesterol (Calc): 142 mg/dL — ABNORMAL HIGH
Non-HDL Cholesterol (Calc): 175 mg/dL — ABNORMAL HIGH (ref <130)
Total CHOL/HDL Ratio: 4.2 (calc) (ref <5.0)
Triglycerides: 192 mg/dL — ABNORMAL HIGH (ref <150)

## 2024-02-29 LAB — HEMOGLOBIN A1C
Hgb A1c MFr Bld: 8.1 % — ABNORMAL HIGH (ref <5.7)
Mean Plasma Glucose: 186 mg/dL
eAG (mmol/L): 10.3 mmol/L

## 2024-03-06 ENCOUNTER — Telehealth: Payer: Self-pay | Admitting: Pharmacist

## 2024-03-06 DIAGNOSIS — N1831 Chronic kidney disease, stage 3a: Secondary | ICD-10-CM

## 2024-03-06 NOTE — Progress Notes (Signed)
 03/06/2024 Name: Analynn Daum MRN: [id number] DOB: 1940-10-13  Chief Complaint  Patient presents with   Medication Assistance    Almas Rake is a 83 y.o. year old female who presented for a telephone visit.  She was referred by their PCP for assistance in managing medication access. She has multiple medical conditions including but not limited to:  hyperlipidemia, hypertension, osteoarthritis, type 2 diabetes, and stage 3 chronic kidney disease.  Subjective:  Care Team: Primary Care Provider: [name], NP ; Next Scheduled Visit: 09/15/2023  Medication Access/Adherence  Current Pharmacy:  [id number] [place], [place] - [address] [place] [address] Phone: [phone] Fax: [phone]   Patient reports affordability concerns with their medications: Yes -Januvia  Patient reports access/transportation concerns to their pharmacy: No  Patient reports adherence concerns with their medications:  Yes  Patient refuses to take Januvia  and was not interested in pursing patient assistance programs at this time.   Diabetes: Metformin  1000 mg 1 tablet twice daily Januvia  100 mg 1 tablet daily (patient not taking)  Patient denies hypoglycemic s/sx including  dizziness, shakiness, sweating. Patient denies hyperglycemic symptoms including  polyuria, polydipsia, polyphagia, nocturia, neuropathy, blurred vision.  Patient declined Patient Assistance through [facility] program at this time. Said she preferred to work on her diet.  She said she had been eating more cake and drinking more sweet drinks than she had been in the past.    Objective:  Lab Results  Component Value Date   HGBA1C 8.1 (H) 02/28/2024    Lab Results  Component Value Date   CREATININE 1.03 (H) 02/28/2024   BUN 20 02/28/2024   NA 143 02/28/2024   K 4.3 02/28/2024   CL 105 02/28/2024   CO2 29 02/28/2024    Lab Results  Component Value Date   CHOL 230 (H) 02/28/2024   HDL 55  02/28/2024   LDLCALC 142 (H) 02/28/2024   TRIG 192 (H) 02/28/2024   CHOLHDL 4.2 02/28/2024    Medications Reviewed Today     Reviewed by Jolee Cassius PARAS, RPH (Pharmacist) on 03/06/24 at 1421  Med List Status: <None>   Medication Order Taking? Sig Documenting Provider Last Dose Status Informant  acetaminophen  (TYLENOL ) 500 MG tablet [id number] Yes Take 500 mg by mouth every 6 (six) hours as needed. [provider]  Active   ALPRAZolam  (XANAX ) 0.5 MG tablet [id number] Yes TAKE ONE HALF TO ONE TABLET BY MOUTH AT BEDTIME AS NEEDED FOR ANXIETY. [name], NP  Active   amLODipine -benazepril  (LOTREL) 10-40 MG capsule [id number] Yes TAKE 1 CAPSULE BY MOUTH EVERY DAY [name], NP  Active   BITTER MELON PO [id number] Yes Take 1 tablet by mouth daily. [provider]  Active   CALCIUM -VITAMIN D PO [id number] Yes Take 1 tablet by mouth daily. [provider]  Active Self  Cyanocobalamin (VITAMIN B 12 PO) [id number] Yes Take 1 tablet by mouth daily. [provider]  Active Self  ezetimibe  (ZETIA ) 10 MG tablet [id number] Yes TAKE 1 TABLET BY MOUTH EVERY DAY Caro, Jessica K, NP  Active   metFORMIN  (GLUCOPHAGE ) 1000 MG tablet [id number] Yes HALF TABLET WITH BREAKFAST FOR 1 WEEK THEN INCREASE TO HALF TABLET WITH BREAKFAST AND SUPPER FOR 1 WEEK THEN INCREASE TO WHOLE TABLET FOR BREAKFAST AND HALF TABLET FOR SUPPER FOR 1 WEEK THEN INCREASE TO WHOLE TABLET FOR BREAKFAST AND SUPPER AND CONTINUE  Patient taking differently: Take 1,000 mg by mouth 2 (two) times daily with  a meal.   [name], NP  Active   Omega-3 Fatty Acids (FISH OIL PO) [id number] Yes Take 1 capsule by mouth daily. [provider]  Active Self  OVER THE COUNTER MEDICATION [id number] Yes Place 1 drop into both eyes daily as needed (for allergies). [facility] eye drop allergy relief [provider]  Active Self           Med Note MACK CASSIUS JINNY Pablo Mar 06, 2024  2:18 PM) DRY EYES   Potassium 99 MG TABS [id number] Yes Take 1 tablet by mouth daily. [provider]  Active   selenium 50 MCG TABS tablet [id number] Yes Take 50 mcg by mouth daily. [provider]  Active   sitaGLIPtin  (JANUVIA ) 100 MG tablet [id number]  Take 1 tablet (100 mg total) by mouth daily. [name], NP  Active   UNABLE TO FIND [id number] Yes 600 mg daily. Bitter melon for blood sugar management [provider]  Consider Medication Status and Discontinue (Duplicate)            Med Note MACK CASSIUS JINNY   Mon Mar 06, 2024  2:19 PM) BITTER MELON--OTC              Assessment/Plan:   Diabetes: - Currently uncontrolled A1c-8.1% - Reviewed long term cardiovascular and renal outcomes of uncontrolled blood sugar -Suggested Patient check her blood sugar at least once daily-she was not interested in sticking her finger nor did she want to use a continuous glucose monitor.  Hyperlipidemia/ASCVD Risk Reduction: -Currently uncontrolled. LDL 142 mg/dl,  -On Ezetimibe --not on statin due to cramps  Follow Up Plan:    Will also message the Provider about getting the Patient scheduled earlier than February Follow up with the Patient after her appointment in February or sooner once she has new labs. Patient said she would consider other therapy if her A1c does not come down.   CASSIUS DOROTHA Brought, PharmD, BCACP Clinical Pharmacist [phone]

## 2024-03-10 ENCOUNTER — Ambulatory Visit: Admitting: Nurse Practitioner

## 2024-03-10 NOTE — Telephone Encounter (Signed)
 Thank you Katina,   Admin,  Can you reach out to her and set up a 3 month follow- also offer her to come in prior to her appt for lab work so we can discuss this at the appt.  I can place labs to be drawn prior if she is agreeable.  Thanks,  Harlene.

## 2024-05-30 ENCOUNTER — Other Ambulatory Visit: Payer: Self-pay

## 2024-06-02 ENCOUNTER — Ambulatory Visit: Payer: Self-pay | Admitting: Nurse Practitioner

## 2024-06-12 ENCOUNTER — Ambulatory Visit: Admitting: Nurse Practitioner

## 2024-06-20 ENCOUNTER — Other Ambulatory Visit: Payer: Self-pay | Admitting: Nurse Practitioner

## 2024-06-20 DIAGNOSIS — F419 Anxiety disorder, unspecified: Secondary | ICD-10-CM

## 2024-06-20 NOTE — Telephone Encounter (Unsigned)
 Copied from CRM #[id number]. Topic: Clinical - Medication Refill >> Jun 20, 2024  1:44 PM Suzette B wrote: Medication: ALPRAZolam  (XANAX ) 0.5 MG tablet  Has the patient contacted their pharmacy? No, she stated the bottle she had showed 0 refills    This is the patient's preferred pharmacy:  [id number] - [place], [place] - [address] [place] [address] Phone: [phone] Fax: [phone]  Is this the correct pharmacy for this prescription? Yes If no, delete pharmacy and type the correct one.   Has the prescription been filled recently? Yes  Is the patient out of the medication? Yes  Has the patient been seen for an appointment in the last year OR does the patient have an upcoming appointment? Yes  Can we respond through MyChart? No  Agent: Please be advised that Rx refills may take up to 3 business days. We ask that you follow-up with your pharmacy.

## 2024-06-21 NOTE — Telephone Encounter (Signed)
 Patient requesting refill.  EPIC LR: 12/08/2023 Contract Date: 04/30/2021 Note Added to upcoming appointment to update.   Pended Rx and sent to [facility] for approval.

## 2024-06-22 MED ORDER — ALPRAZOLAM 0.5 MG PO TABS: ORAL_TABLET | ORAL | 0 refills | Status: DC

## 2024-06-30 ENCOUNTER — Other Ambulatory Visit

## 2024-06-30 ENCOUNTER — Other Ambulatory Visit: Payer: Self-pay | Admitting: Nurse Practitioner

## 2024-06-30 DIAGNOSIS — N1831 Chronic kidney disease, stage 3a: Secondary | ICD-10-CM

## 2024-06-30 DIAGNOSIS — E782 Mixed hyperlipidemia: Secondary | ICD-10-CM

## 2024-06-30 DIAGNOSIS — I10 Essential (primary) hypertension: Secondary | ICD-10-CM

## 2024-07-01 LAB — CBC WITH DIFFERENTIAL/PLATELET
Absolute Lymphocytes: 2141 {cells}/uL (ref 850–3900)
Absolute Monocytes: 778 {cells}/uL (ref 200–950)
Basophils Absolute: 51 {cells}/uL (ref 0–200)
Basophils Relative: 0.5 %
Eosinophils Absolute: 141 {cells}/uL (ref 15–500)
Eosinophils Relative: 1.4 %
HCT: 46.3 % — ABNORMAL HIGH (ref 35.9–46.0)
Hemoglobin: 15.1 g/dL (ref 11.7–15.5)
MCH: 29 pg (ref 27.0–33.0)
MCHC: 32.6 g/dL (ref 31.6–35.4)
MCV: 88.9 fL (ref 81.4–101.7)
MPV: 10.2 fL (ref 7.5–12.5)
Monocytes Relative: 7.7 %
Neutro Abs: 6989 {cells}/uL (ref 1500–7800)
Neutrophils Relative %: 69.2 %
Platelets: 343 Thousand/uL (ref 140–400)
RBC: 5.21 Million/uL — ABNORMAL HIGH (ref 3.80–5.10)
RDW: 12.6 % (ref 11.0–15.0)
Total Lymphocyte: 21.2 %
WBC: 10.1 Thousand/uL (ref 3.8–10.8)

## 2024-07-01 LAB — COMPREHENSIVE METABOLIC PANEL WITH GFR
AG Ratio: 1.7 (calc) (ref 1.0–2.5)
ALT: 21 U/L (ref 6–29)
AST: 15 U/L (ref 10–35)
Albumin: 4.2 g/dL (ref 3.6–5.1)
Alkaline phosphatase (APISO): 72 U/L (ref 37–153)
BUN: 17 mg/dL (ref 7–25)
CO2: 29 mmol/L (ref 20–32)
Calcium: 9.6 mg/dL (ref 8.6–10.4)
Chloride: 105 mmol/L (ref 98–110)
Creat: 0.88 mg/dL (ref 0.60–0.95)
Globulin: 2.5 g/dL (ref 1.9–3.7)
Glucose, Bld: 163 mg/dL — ABNORMAL HIGH (ref 65–99)
Potassium: 4.1 mmol/L (ref 3.5–5.3)
Sodium: 142 mmol/L (ref 135–146)
Total Bilirubin: 0.4 mg/dL (ref 0.2–1.2)
Total Protein: 6.7 g/dL (ref 6.1–8.1)
eGFR: 66 mL/min/1.73m2 (ref >=60)

## 2024-07-01 LAB — LIPID PANEL
Cholesterol: 222 mg/dL — ABNORMAL HIGH (ref <200)
HDL: 49 mg/dL — ABNORMAL LOW (ref >=50)
LDL Cholesterol (Calc): 144 mg/dL — ABNORMAL HIGH
Non-HDL Cholesterol (Calc): 173 mg/dL — ABNORMAL HIGH (ref <130)
Total CHOL/HDL Ratio: 4.5 (calc) (ref <5.0)
Triglycerides: 159 mg/dL — ABNORMAL HIGH (ref <150)

## 2024-07-01 LAB — HEMOGLOBIN A1C
Hgb A1c MFr Bld: 8.3 % — ABNORMAL HIGH (ref <5.7)
Mean Plasma Glucose: 192 mg/dL
eAG (mmol/L): 10.6 mmol/L

## 2024-07-03 ENCOUNTER — Ambulatory Visit: Payer: Self-pay

## 2024-07-03 ENCOUNTER — Encounter: Admitting: Nurse Practitioner

## 2024-07-03 ENCOUNTER — Ambulatory Visit: Payer: Self-pay | Admitting: Nurse Practitioner

## 2024-07-03 NOTE — Telephone Encounter (Signed)
 Spoke with patient, patient rescheduled to next available for January 12,2025. Patient states  I have had so many problems with [facility] and [facility], I'd rather wait to see Harlene, and I prefer not to see anyone else.   Patient then stated  I'm good, I have a crutch, and the pain causes difficulty when sitting, but I'm good.   Patient would like labs addressed

## 2024-07-03 NOTE — Telephone Encounter (Signed)
 Labs resulted, she needs to make scheduled appts so we can fill medications.

## 2024-07-03 NOTE — Telephone Encounter (Signed)
 FYI Only or Action Required?: Action required by provider: update on patient condition.  Patient was last seen in primary care on 02/28/2024 by Glenda Harlene POUR, NP.  Called Nurse Triage reporting Knee Pain.  Symptoms began several days ago.  Interventions attempted: Rest, hydration, or home remedies and Ice/heat application.  Symptoms are: gradually worsening.  Triage Disposition: See PCP When Office is Open (Within 3 Days)  Patient/caregiver understands and will follow disposition?: Yes  Copied from CRM #[id number]. Topic: Clinical - Red Word Triage >> Jul 03, 2024  9:38 AM Miquel SAILOR wrote: Red Word that prompted transfer to Nurse Triage: RT Pain in knee/since 12/13 Hard to sit going to bathroom. Pain level 9/10 gtting worse daily. Does not want wait for NT. Needs call back [phone] Reason for Disposition  [1] MODERATE pain (e.g., interferes with normal activities, limping) AND [2] present > 3 days  Answer Assessment - Initial Assessment Questions On Saturday- Sat down and had to roll over on the couch to get up her knee hurt so bad if she sits down- she is unable to bend her knee enough to put her foot flat on the floor. Having a hard time sitting down on toilet Ankle to the knee felt some numbness- lasted a day and resolved Denies redness or swelling, denies heat. Denies fever, denies back pain, no SOB  On the side of the knee- sensitive- sore to touch. Denies hot hard Tylenol , heating pad- keep leg elevated.   Had to cancel appt today due to pain and unable to get in and out of car or drive safe. Advised UC for any worsening. Only wants to see Harlene.  Needs call regarding lab work done 12/12.  Alprazolam - no refills on script? Usually has 5 refills. Asking for refills to be sent   1. LOCATION and RADIATION: Where is the pain located?      Right Knee- denies radiating 2. QUALITY: What does the pain feel like?  (e.g., sharp, dull, aching, burning)     Sharp pain 3.  SEVERITY: How bad is the pain? What does it keep you from doing?   (Scale 1-10; or mild, moderate, severe)     9-10/10 4. ONSET: When did the pain start? Does it come and go, or is it there all the time?     Saturday  5. RECURRENT: Have you had this pain before? If Yes, ask: When, and what happened then?     Mild knee pains previously  6. SETTING: Has there been any recent work, exercise or other activity that involved that part of the body?      Sitting to standing  7. AGGRAVATING FACTORS: What makes the knee pain worse? (e.g., walking, climbing stairs, running)     Stood up and sharp pain  8. ASSOCIATED SYMPTOMS: Is there any swelling or redness of the knee?     Had some numbness from knee to ankle but denies since- was not in toes 9. OTHER SYMPTOMS: Do you have any other symptoms? (e.g., calf pain, chest pain, difficulty breathing, fever)     Denies other sx.  Protocols used: Knee Pain-A-AH

## 2024-07-03 NOTE — Telephone Encounter (Signed)
 Multiple concerns, see them listed below in call intake from E2C2

## 2024-07-03 NOTE — Progress Notes (Signed)
 This encounter was created in error - please disregard.

## 2024-07-03 NOTE — Telephone Encounter (Signed)
 Needs to keep follow up appt- she had appt today and did not come

## 2024-07-24 ENCOUNTER — Other Ambulatory Visit: Payer: Self-pay | Admitting: Nurse Practitioner

## 2024-07-24 DIAGNOSIS — F419 Anxiety disorder, unspecified: Secondary | ICD-10-CM

## 2024-07-24 DIAGNOSIS — E782 Mixed hyperlipidemia: Secondary | ICD-10-CM

## 2024-07-24 NOTE — Telephone Encounter (Signed)
 Copied from CRM [id number]. Topic: Clinical - Medication Refill >> Jul 24, 2024 12:44 PM Dominique A wrote: Medication:  ALPRAZolam  (XANAX ) 0.5 MG tablet ezetimibe  (ZETIA ) 10 MG tablet    Has the patient contacted their pharmacy? Yes Alprazolam  is showing 0 refills. Patient states that she takes this medication every day and use to get refills for five months.   This is the patient's preferred pharmacy:  [facility] [address] [place] - [address] [place] [address] Phone: [phone] Fax: [phone]  Is this the correct pharmacy for this prescription? Yes    Has the prescription been filled recently? Yes Last refilled on 06/22/24 : alprazolam  Last refilled on 07/07/24: ezetimibe   Is the patient out of the medication? Yes. Patient is out of alprazolam .    Has the patient been seen for an appointment in the last year OR does the patient have an upcoming appointment? Yes  Can we respond through MyChart? Yes  Agent: Please be advised that Rx refills may take up to 3 business days. We ask that you follow-up with your pharmacy.

## 2024-07-25 NOTE — Telephone Encounter (Signed)
 Patient is requesting a refill of the following medications: Requested Prescriptions   Pending Prescriptions Disp Refills   ALPRAZolam  (XANAX ) 0.5 MG tablet 30 tablet 0    Sig: TAKE ONE HALF TO ONE TABLET BY MOUTH AT BEDTIME AS NEEDED FOR ANXIETY. To keep follow up appt for additional refills.   ezetimibe  (ZETIA ) 10 MG tablet 90 tablet 3    Sig: Take 1 tablet (10 mg total) by mouth daily.    Date of last refill: 06/22/2024  Refill amount: 30/0  Treatment agreement date: 07/03/2024

## 2024-07-28 ENCOUNTER — Telehealth: Payer: Self-pay

## 2024-07-28 DIAGNOSIS — E782 Mixed hyperlipidemia: Secondary | ICD-10-CM

## 2024-07-28 MED ORDER — EZETIMIBE 10 MG PO TABS: 10.0000 mg | ORAL_TABLET | Freq: Every day | ORAL | 3 refills | Status: AC

## 2024-07-28 NOTE — Telephone Encounter (Signed)
 Spoke with patient this afternoon in regards of medication because she is out of her Xanax  which was denied and that the stated that she does have an appointment on Monday at 2:40 pm.

## 2024-07-31 ENCOUNTER — Encounter: Payer: Self-pay | Admitting: Nurse Practitioner

## 2024-07-31 ENCOUNTER — Ambulatory Visit (INDEPENDENT_AMBULATORY_CARE_PROVIDER_SITE_OTHER): Admitting: Nurse Practitioner

## 2024-07-31 ENCOUNTER — Other Ambulatory Visit: Payer: Self-pay | Admitting: Nurse Practitioner

## 2024-07-31 VITALS — BP 126/74 | HR 90 | Temp 97.1°F | Ht 64.0 in | Wt 178.2 lb

## 2024-07-31 DIAGNOSIS — Z7984 Long term (current) use of oral hypoglycemic drugs: Secondary | ICD-10-CM | POA: Diagnosis not present

## 2024-07-31 DIAGNOSIS — M150 Primary generalized (osteo)arthritis: Secondary | ICD-10-CM | POA: Diagnosis not present

## 2024-07-31 DIAGNOSIS — M25511 Pain in right shoulder: Secondary | ICD-10-CM | POA: Diagnosis not present

## 2024-07-31 DIAGNOSIS — N1831 Chronic kidney disease, stage 3a: Secondary | ICD-10-CM | POA: Diagnosis not present

## 2024-07-31 DIAGNOSIS — F17200 Nicotine dependence, unspecified, uncomplicated: Secondary | ICD-10-CM | POA: Diagnosis not present

## 2024-07-31 DIAGNOSIS — F419 Anxiety disorder, unspecified: Secondary | ICD-10-CM | POA: Diagnosis not present

## 2024-07-31 DIAGNOSIS — E1122 Type 2 diabetes mellitus with diabetic chronic kidney disease: Secondary | ICD-10-CM

## 2024-07-31 DIAGNOSIS — Z79899 Other long term (current) drug therapy: Secondary | ICD-10-CM

## 2024-07-31 DIAGNOSIS — I10 Essential (primary) hypertension: Secondary | ICD-10-CM

## 2024-07-31 DIAGNOSIS — E782 Mixed hyperlipidemia: Secondary | ICD-10-CM

## 2024-07-31 MED ORDER — METFORMIN HCL 1000 MG PO TABS: 1000.0000 mg | ORAL_TABLET | Freq: Two times a day (BID) | ORAL | 0 refills | Status: AC
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
## 2024-07-31 NOTE — Telephone Encounter (Signed)
Will see her at appt

## 2024-07-31 NOTE — Telephone Encounter (Signed)
 Patient is requesting a refill of the following medications: Requested Prescriptions   Pending Prescriptions Disp Refills   ALPRAZolam  (XANAX ) 0.5 MG tablet [Pharmacy Med Name: ALPRAZOLAM  0.5 MG TABLET] 30 tablet 0    Sig: TAKE ONE HALF TO ONE TABLET BY MOUTH AT BEDTIME AS NEEDED FOR ANXIETY. To keep follow up appt for additional refills.    Date of last refill:06/22/24  Refill amount: 30  Treatment agreement date: Not on file, patient has pending appointment today to sign

## 2024-08-01 ENCOUNTER — Ambulatory Visit: Payer: Self-pay | Admitting: Nurse Practitioner

## 2024-08-01 LAB — MICROALBUMIN / CREATININE URINE RATIO
Creatinine, Urine: 137 mg/dL (ref 20–275)
Microalb Creat Ratio: 22 mg/g{creat}
Microalb, Ur: 3 mg/dL

## 2024-08-01 LAB — DRUG MONITORING, PANEL 6 WITH CONFIRMATION, URINE
6 Acetylmorphine: NEGATIVE ng/mL
Alcohol Metabolites: NEGATIVE ng/mL
Amphetamines: NEGATIVE ng/mL
Barbiturates: NEGATIVE ng/mL
Benzodiazepines: NEGATIVE ng/mL
Cocaine Metabolite: NEGATIVE ng/mL
Creatinine: 143.8 mg/dL
Marijuana Metabolite: NEGATIVE ng/mL
Methadone Metabolite: NEGATIVE ng/mL
Opiates: NEGATIVE ng/mL
Oxidant: NEGATIVE ug/mL
Oxycodone: NEGATIVE ng/mL
Phencyclidine: NEGATIVE ng/mL
pH: 5 (ref 4.5–9.0)

## 2024-08-01 LAB — DM TEMPLATE

## 2024-08-08 ENCOUNTER — Ambulatory Visit: Admitting: Surgical

## 2024-08-21 ENCOUNTER — Ambulatory Visit: Admitting: Surgical

## 2024-09-01 ENCOUNTER — Ambulatory Visit: Payer: Self-pay | Admitting: Nurse Practitioner

## 2024-10-25 ENCOUNTER — Other Ambulatory Visit

## 2024-10-30 ENCOUNTER — Ambulatory Visit: Admitting: Nurse Practitioner
